# Patient Record
Sex: Male | Born: 1985 | Race: Black or African American | Hispanic: No | Marital: Single | Smoking: Current some day smoker
Health system: Southern US, Community
[De-identification: ages and names within clinical notes are randomized; demographics above are authoritative.]

## PROBLEM LIST (undated history)

## (undated) DIAGNOSIS — F259 Schizoaffective disorder, unspecified: Secondary | ICD-10-CM

---
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
## 2013-02-05 ENCOUNTER — Emergency Department (HOSPITAL_COMMUNITY)
Admission: EM | Admit: 2013-02-05 | Discharge: 2013-02-05 | Disposition: A | Discharge status: Home / Self Care | Payer: Medicaid Other | Attending: Emergency Medicine | Admitting: Emergency Medicine

## 2013-02-05 ENCOUNTER — Encounter (HOSPITAL_COMMUNITY): Payer: Self-pay

## 2013-02-05 DIAGNOSIS — L089 Local infection of the skin and subcutaneous tissue, unspecified: Secondary | ICD-10-CM | POA: Insufficient documentation

## 2013-02-05 MED ORDER — DOXYCYCLINE HYCLATE 100 MG PO CAPS: 100.0000 mg | ORAL_CAPSULE | Freq: Two times a day (BID) | ORAL | Status: DC

## 2013-02-05 MED ORDER — DOXYCYCLINE HYCLATE 100 MG PO TABS
100.0000 mg | ORAL_TABLET | Freq: Once | ORAL | Status: AC
  Administered 2013-02-05: 100 mg via ORAL
  Filled 2013-02-05: qty 1

## 2013-02-05 MED ORDER — HYDROCODONE-ACETAMINOPHEN 5-325 MG PO TABS: 2.0000 | ORAL_TABLET | ORAL | Status: DC | PRN

## 2013-02-05 NOTE — ED Provider Notes (Signed)
   History    CSN: [id number] Arrival date & time 02/05/13  1011  First MD Initiated Contact with Patient 02/05/13 1022     Chief Complaint  Patient presents with  . Groin Swelling    HPI   Pt noticed swelling to groin area (inguinal) area since yesterday . States it hurts to walk and move around. Denies any urinary symptoms.no history of similar symptoms in the past.  No fever chills.  No dysuria.  No penile discharge.  No testicular pain.  No nausea vomiting.  Bowel movements are normal.      History reviewed. No pertinent past medical history. History reviewed. No pertinent past surgical history. No family history on file. History  Substance Use Topics  . Smoking status: Never Smoker   . Smokeless tobacco: Not on file  . Alcohol Use: Yes     Comment: socially    Review of Systems All other systems reviewed and are negative Allergies  Review of patient's allergies indicates no known allergies.  Home Medications   Current Outpatient Rx  Name  Route  Sig  Dispense  Refill  . doxycycline (VIBRAMYCIN) 100 MG capsule   Oral   Take 1 capsule (100 mg total) by mouth 2 (two) times daily.   20 capsule   0   . HYDROcodone-acetaminophen (NORCO/VICODIN) 5-325 MG per tablet   Oral   Take 2 tablets by mouth every 4 (four) hours as needed for pain.   20 tablet   0    BP 115/69  Pulse 64  Temp(Src) 98.3 F (36.8 C) (Oral)  Resp 18  Ht 5\' 11"  (1.803 m)  Wt 177 lb (80.287 kg)  BMI 24.7 kg/m2  SpO2 99% Physical Exam  Nursing note and vitals reviewed. Constitutional: He is oriented to person, place, and time. He appears well-developed and well-nourished. No distress.  HENT:  Head: Normocephalic and atraumatic.  Eyes: Pupils are equal, round, and reactive to light.  Neck: Normal range of motion.  Cardiovascular: Normal rate and intact distal pulses.   Pulmonary/Chest: No respiratory distress.  Abdominal: Normal appearance. He exhibits no distension.  Genitourinary:  Penis normal.     Area with mild redness and some tenderness.  No direct or indirect hernia appreciated.  No evidence of epididymal or testicular abnormalities.  Musculoskeletal: Normal range of motion.  Neurological: He is alert and oriented to person, place, and time. No cranial nerve deficit.  Skin: Skin is warm and dry. No rash noted.  Psychiatric: He has a normal mood and affect. His behavior is normal.    ED Course  Procedures (including critical care time) Labs Reviewed - No data to display No results found. 1. Soft tissue infection     MDM  Will treat and bring back for recheck in 48 hours.  Nelia Shi, MD 02/05/13 2056

## 2013-02-05 NOTE — ED Notes (Signed)
Pt noticed swelling to groin area (inguinal) area since yesterday after lifting 25 lb weights. States it hurts to walk and move around. Denies any urinary symptoms.

## 2013-02-09 ENCOUNTER — Encounter (HOSPITAL_COMMUNITY): Payer: Self-pay

## 2013-02-09 ENCOUNTER — Emergency Department (HOSPITAL_COMMUNITY)
Admission: EM | Admit: 2013-02-09 | Discharge: 2013-02-09 | Disposition: A | Discharge status: Home / Self Care | Payer: Medicaid Other | Attending: Emergency Medicine | Admitting: Emergency Medicine

## 2013-02-09 DIAGNOSIS — L02219 Cutaneous abscess of trunk, unspecified: Secondary | ICD-10-CM | POA: Insufficient documentation

## 2013-02-09 DIAGNOSIS — L03319 Cellulitis of trunk, unspecified: Secondary | ICD-10-CM | POA: Insufficient documentation

## 2013-02-09 DIAGNOSIS — L02214 Cutaneous abscess of groin: Secondary | ICD-10-CM

## 2013-02-09 LAB — URINALYSIS, ROUTINE W REFLEX MICROSCOPIC
Bilirubin Urine: NEGATIVE
Hgb urine dipstick: NEGATIVE
Nitrite: NEGATIVE
Specific Gravity, Urine: 1.031 — ABNORMAL HIGH (ref 1.005–1.030)
Urobilinogen, UA: 1 mg/dL (ref 0.0–1.0)
pH: 6.5 (ref 5.0–8.0)

## 2013-02-09 NOTE — ED Provider Notes (Signed)
History    CSN: [id number] Arrival date & time 02/09/13  1942  First MD Initiated Contact with Patient 02/09/13 2009     Chief Complaint  Patient presents with  . Groin Pain   (Consider location/radiation/quality/duration/timing/severity/associated sxs/prior Treatment) HPI Comments: Patient is a 27 year old male with no significant past medical history who presents for left-sided groin pain x 5 days. Patient states the pain is throbbing, nonradiating and intermittent, worse with walking and palpation and alleviated with vicodin. Patient was seen and evaluated for this complaint on 02/05/13; dx with inguinal abscess and d/c'd with Doxy and Norco. Patient states that he has been compliant with these medications. He also states that abscess began to spontaneously drain 2 days ago. He has been trying warm soaks with mild relief. Patient denies associated fever, penile discharge, penile redness or swelling, testicular or scrotal pain, dysuria, hematuria, and urinary frequency.  Patient is a 27 y.o. male presenting with groin pain. The history is provided by the patient. No language interpreter was used.  Groin Pain Pertinent negatives include no fever, numbness or weakness.   History reviewed. No pertinent past medical history. History reviewed. No pertinent past surgical history. History reviewed. No pertinent family history. History  Substance Use Topics  . Smoking status: Never Smoker   . Smokeless tobacco: Not on file  . Alcohol Use: Yes     Comment: socially    Review of Systems  Constitutional: Negative for fever.  Genitourinary: Negative for dysuria, hematuria, discharge, penile swelling, scrotal swelling, penile pain and testicular pain.  Skin:       +abscess  Neurological: Negative for weakness and numbness.  All other systems reviewed and are negative.    Allergies  Review of patient's allergies indicates no known allergies.  Home Medications   Current Outpatient Rx    Name  Route  Sig  Dispense  Refill  . doxycycline (VIBRAMYCIN) 100 MG capsule   Oral   Take 1 capsule (100 mg total) by mouth 2 (two) times daily.   20 capsule   0   . HYDROcodone-acetaminophen (NORCO/VICODIN) 5-325 MG per tablet   Oral   Take 2 tablets by mouth every 4 (four) hours as needed for pain.   20 tablet   0    BP 121/53  Pulse 62  Temp(Src) 98.1 F (36.7 C) (Oral)  Resp 16  SpO2 100% Physical Exam  Nursing note and vitals reviewed. Constitutional: He is oriented to person, place, and time. He appears well-developed and well-nourished. No distress.  HENT:  Head: Normocephalic and atraumatic.  Eyes: Conjunctivae and EOM are normal. No scleral icterus.  Neck: Normal range of motion.  Cardiovascular: Normal rate, regular rhythm and intact distal pulses.   Pulmonary/Chest: Effort normal. No respiratory distress.  Abdominal: Soft. He exhibits no distension. There is no tenderness. Hernia confirmed negative in the right inguinal area and confirmed negative in the left inguinal area.  Genitourinary: Testes normal and penis normal.    Cremasteric reflex is present. Right testis shows no swelling and no tenderness. Left testis shows no swelling and no tenderness. No penile erythema or penile tenderness. No discharge found.  Chaperoned GU exam findings as described  Lymphadenopathy:       Right: No inguinal adenopathy present.  Neurological: He is alert and oriented to person, place, and time.  Skin: He is not diaphoretic.  Psychiatric: He has a normal mood and affect. His behavior is normal.    ED Course  Procedures (including critical  care time) Labs Reviewed  URINALYSIS, ROUTINE W REFLEX MICROSCOPIC - Abnormal; Notable for the following:    Color, Urine AMBER (*)    Specific Gravity, Urine 1.031 (*)    All other components within normal limits   No results found.  1. Abscess of groin, left    MDM  28 year old male presents for uncomplicated abscess of the  left groin. Patient afebrile and hemodynamically stable. Physical exam as above. Abscess site actively draining serosanguineous fluid. There is mild tenderness to palpation and erythema, but no heat-to-touch or area of fluctuance to suspect drainable abscess. Patient has completed half of doxy course. UA negative for infection. Given findings and work up in ED today, believe patient is stable and appropriate for discharge with urology followup for further evaluation of symptoms. Patient advised to continue antibiotics as prescribed and to use topical warm compresses or warm soaks to promote further drainage. Indications for return discussed. Patient verbalizes comfort and understanding with this d/c plan.     Antony Madura, PA-C 02/09/13 2202

## 2013-02-09 NOTE — ED Notes (Signed)
Pt c/o Left side groin pain x1 week, pt seen here 02/05/2013 for the same. Pt c/o increase pain

## 2013-02-10 NOTE — ED Provider Notes (Signed)
  Medical screening examination/treatment/procedure(s) were performed by non-physician practitioner and as supervising physician I was immediately available for consultation/collaboration.    Gerhard Munch, MD 02/10/13 0002

## 2019-01-22 ENCOUNTER — Emergency Department (HOSPITAL_COMMUNITY)
Admission: EM | Admit: 2019-01-22 | Discharge: 2019-01-22 | Disposition: A | Discharge status: Home / Self Care | Payer: Medicaid Other | Attending: Emergency Medicine | Admitting: Emergency Medicine

## 2019-01-22 ENCOUNTER — Other Ambulatory Visit: Payer: Self-pay

## 2019-01-22 ENCOUNTER — Encounter (HOSPITAL_COMMUNITY): Payer: Self-pay | Admitting: Emergency Medicine

## 2019-01-22 ENCOUNTER — Emergency Department (HOSPITAL_COMMUNITY): Payer: Medicaid Other

## 2019-01-22 DIAGNOSIS — M25511 Pain in right shoulder: Secondary | ICD-10-CM | POA: Diagnosis not present

## 2019-01-22 DIAGNOSIS — M7918 Myalgia, other site: Secondary | ICD-10-CM

## 2019-01-22 MED ORDER — NAPROXEN 500 MG PO TABS: 500.0000 mg | ORAL_TABLET | Freq: Two times a day (BID) | ORAL | 0 refills | Status: DC
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
## 2019-01-22 NOTE — Discharge Instructions (Addendum)
Take naproxen 2 times a day with meals.  Do not take other anti-inflammatories at the same time open (Advil, Motrin, ibuprofen, Aleve). You may supplement with Tylenol if you need further pain control. Use muscle creams such as salonpas, icy hot, bengay, biofreeze for pain control.  Use ice packs or heating pads if this helps control your pain. You will likely have continued muscle stiffness and soreness over the next couple days.   Return to the emergency room if you develop numbness, severe pain with breathing, your are dropping things from your hand, or any new or worsening symptoms.
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
## 2019-02-06 ENCOUNTER — Encounter (HOSPITAL_COMMUNITY): Payer: Self-pay | Admitting: Emergency Medicine

## 2019-02-06 ENCOUNTER — Emergency Department (HOSPITAL_COMMUNITY)
Admission: EM | Admit: 2019-02-06 | Discharge: 2019-02-06 | Disposition: A | Discharge status: Home / Self Care | Payer: Medicaid Other | Attending: Emergency Medicine | Admitting: Emergency Medicine

## 2019-02-06 ENCOUNTER — Other Ambulatory Visit: Payer: Self-pay

## 2019-02-06 DIAGNOSIS — Z5321 Procedure and treatment not carried out due to patient leaving prior to being seen by health care provider: Secondary | ICD-10-CM | POA: Insufficient documentation

## 2019-02-06 DIAGNOSIS — M545 Low back pain: Secondary | ICD-10-CM | POA: Diagnosis present

## 2019-02-06 DIAGNOSIS — Y9389 Activity, other specified: Secondary | ICD-10-CM | POA: Insufficient documentation

## 2019-02-06 DIAGNOSIS — Y929 Unspecified place or not applicable: Secondary | ICD-10-CM | POA: Diagnosis not present

## 2019-02-06 DIAGNOSIS — Y998 Other external cause status: Secondary | ICD-10-CM | POA: Diagnosis not present

## 2019-02-06 NOTE — ED Notes (Signed)
Bed: WTR7 Expected date:  Expected time:  Means of arrival:  Comments: 
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
## 2019-05-12 ENCOUNTER — Other Ambulatory Visit: Payer: Self-pay

## 2019-05-12 ENCOUNTER — Emergency Department (HOSPITAL_COMMUNITY)
Admission: EM | Admit: 2019-05-12 | Discharge: 2019-05-12 | Disposition: A | Discharge status: Home / Self Care | Payer: Medicaid Other | Attending: Emergency Medicine | Admitting: Emergency Medicine

## 2019-05-12 ENCOUNTER — Encounter (HOSPITAL_COMMUNITY): Payer: Self-pay | Admitting: Emergency Medicine

## 2019-05-12 DIAGNOSIS — R112 Nausea with vomiting, unspecified: Secondary | ICD-10-CM | POA: Diagnosis present

## 2019-05-12 DIAGNOSIS — Z5321 Procedure and treatment not carried out due to patient leaving prior to being seen by health care provider: Secondary | ICD-10-CM | POA: Diagnosis not present

## 2019-05-12 DIAGNOSIS — R070 Pain in throat: Secondary | ICD-10-CM | POA: Insufficient documentation

## 2019-05-12 LAB — URINALYSIS, ROUTINE W REFLEX MICROSCOPIC
Bilirubin Urine: NEGATIVE
Glucose, UA: NEGATIVE mg/dL
Hgb urine dipstick: NEGATIVE
Ketones, ur: NEGATIVE mg/dL
Leukocytes,Ua: NEGATIVE
Nitrite: NEGATIVE
Protein, ur: NEGATIVE mg/dL
Specific Gravity, Urine: 1.027 (ref 1.005–1.030)
pH: 6 (ref 5.0–8.0)

## 2019-05-12 LAB — CBC
HCT: 46.8 % (ref 39.0–52.0)
Hemoglobin: 15 g/dL (ref 13.0–17.0)
MCH: 30.1 pg (ref 26.0–34.0)
MCHC: 32.1 g/dL (ref 30.0–36.0)
MCV: 94 fL (ref 80.0–100.0)
Platelets: 293 10*3/uL (ref 150–400)
RBC: 4.98 MIL/uL (ref 4.22–5.81)
RDW: 11.9 % (ref 11.5–15.5)
WBC: 6 10*3/uL (ref 4.0–10.5)
nRBC: 0 % (ref 0.0–0.2)

## 2019-05-12 LAB — COMPREHENSIVE METABOLIC PANEL
ALT: 16 U/L (ref 0–44)
AST: 21 U/L (ref 15–41)
Albumin: 4.4 g/dL (ref 3.5–5.0)
Alkaline Phosphatase: 53 U/L (ref 38–126)
Anion gap: 10 (ref 5–15)
BUN: 16 mg/dL (ref 6–20)
CO2: 23 mmol/L (ref 22–32)
Calcium: 9.5 mg/dL (ref 8.9–10.3)
Chloride: 104 mmol/L (ref 98–111)
Creatinine, Ser: 1.21 mg/dL (ref 0.61–1.24)
GFR calc Af Amer: >60 mL/min (ref >60)
GFR calc non Af Amer: >60 mL/min (ref >60)
Glucose, Bld: 98 mg/dL (ref 70–99)
Potassium: 4 mmol/L (ref 3.5–5.1)
Sodium: 137 mmol/L (ref 135–145)
Total Bilirubin: 0.6 mg/dL (ref 0.3–1.2)
Total Protein: 8 g/dL (ref 6.5–8.1)

## 2019-05-12 LAB — LIPASE, BLOOD: Lipase: 24 U/L (ref 11–51)

## 2019-05-12 MED ORDER — SODIUM CHLORIDE 0.9% FLUSH: 3.0000 mL | Freq: Once | INTRAVENOUS | Status: DC

## 2019-05-12 NOTE — ED Notes (Signed)
No answer in waiting room when called for vitals recheck

## 2019-05-12 NOTE — ED Triage Notes (Signed)
C/o nausea and vomiting x 2 weeks.  Denies abd pain and diarrhea.  States he had a sore throat that has resolved.

## 2019-11-13 ENCOUNTER — Emergency Department (HOSPITAL_COMMUNITY)
Admission: EM | Admit: 2019-11-13 | Discharge: 2019-11-13 | Disposition: A | Discharge status: Home / Self Care | Payer: Medicaid Other | Attending: Emergency Medicine | Admitting: Emergency Medicine

## 2019-11-13 ENCOUNTER — Encounter (HOSPITAL_COMMUNITY): Payer: Self-pay | Admitting: Emergency Medicine

## 2019-11-13 ENCOUNTER — Emergency Department (HOSPITAL_COMMUNITY): Payer: Medicaid Other

## 2019-11-13 DIAGNOSIS — Z79899 Other long term (current) drug therapy: Secondary | ICD-10-CM | POA: Diagnosis not present

## 2019-11-13 DIAGNOSIS — U071 COVID-19: Secondary | ICD-10-CM | POA: Insufficient documentation

## 2019-11-13 DIAGNOSIS — R112 Nausea with vomiting, unspecified: Secondary | ICD-10-CM | POA: Diagnosis present

## 2019-11-13 LAB — CBC
HCT: 48.7 % (ref 39.0–52.0)
Hemoglobin: 15.8 g/dL (ref 13.0–17.0)
MCH: 30.1 pg (ref 26.0–34.0)
MCHC: 32.4 g/dL (ref 30.0–36.0)
MCV: 92.8 fL (ref 80.0–100.0)
Platelets: 207 10*3/uL (ref 150–400)
RBC: 5.25 MIL/uL (ref 4.22–5.81)
RDW: 11.6 % (ref 11.5–15.5)
WBC: 3.6 10*3/uL — ABNORMAL LOW (ref 4.0–10.5)
nRBC: 0 % (ref 0.0–0.2)

## 2019-11-13 LAB — COMPREHENSIVE METABOLIC PANEL
ALT: 17 U/L (ref 0–44)
AST: 29 U/L (ref 15–41)
Albumin: 4.1 g/dL (ref 3.5–5.0)
Alkaline Phosphatase: 48 U/L (ref 38–126)
Anion gap: 10 (ref 5–15)
BUN: 13 mg/dL (ref 6–20)
CO2: 23 mmol/L (ref 22–32)
Calcium: 8.8 mg/dL — ABNORMAL LOW (ref 8.9–10.3)
Chloride: 104 mmol/L (ref 98–111)
Creatinine, Ser: 1.08 mg/dL (ref 0.61–1.24)
GFR calc Af Amer: >60 mL/min (ref >60)
GFR calc non Af Amer: >60 mL/min (ref >60)
Glucose, Bld: 117 mg/dL — ABNORMAL HIGH (ref 70–99)
Potassium: 3.6 mmol/L (ref 3.5–5.1)
Sodium: 137 mmol/L (ref 135–145)
Total Bilirubin: 0.5 mg/dL (ref 0.3–1.2)
Total Protein: 7.6 g/dL (ref 6.5–8.1)

## 2019-11-13 LAB — URINALYSIS, ROUTINE W REFLEX MICROSCOPIC
Bacteria, UA: NONE SEEN
Bilirubin Urine: NEGATIVE
Glucose, UA: NEGATIVE mg/dL
Hgb urine dipstick: NEGATIVE
Ketones, ur: 5 mg/dL — AB
Leukocytes,Ua: NEGATIVE
Nitrite: NEGATIVE
Protein, ur: 30 mg/dL — AB
Specific Gravity, Urine: 1.024 (ref 1.005–1.030)
pH: 6 (ref 5.0–8.0)

## 2019-11-13 LAB — POC SARS CORONAVIRUS 2 AG -  ED: SARS Coronavirus 2 Ag: POSITIVE — AB

## 2019-11-13 LAB — LIPASE, BLOOD: Lipase: 26 U/L (ref 11–51)

## 2019-11-13 MED ORDER — LACTATED RINGERS IV BOLUS
1000.0000 mL | Freq: Once | INTRAVENOUS | Status: AC
  Administered 2019-11-13: 18:00:00 1000 mL via INTRAVENOUS

## 2019-11-13 MED ORDER — ONDANSETRON 4 MG PO TBDP
4.0000 mg | ORAL_TABLET | Freq: Once | ORAL | Status: AC | PRN
  Administered 2019-11-13: 4 mg via ORAL
  Filled 2019-11-13: qty 1

## 2019-11-13 MED ORDER — ONDANSETRON HCL 4 MG PO TABS: 4.0000 mg | ORAL_TABLET | Freq: Three times a day (TID) | ORAL | 0 refills | Status: DC | PRN

## 2019-11-13 MED ORDER — SODIUM CHLORIDE 0.9% FLUSH: 3.0000 mL | Freq: Once | INTRAVENOUS | Status: DC

## 2019-11-13 NOTE — ED Triage Notes (Signed)
Pt c/o n/v, chills, intermittent fatigue, minor cough, back pain, and subjective fever x 3-4 days. No known sick exposure, no covid test

## 2019-11-13 NOTE — ED Notes (Signed)
Pt verbalizes understanding of d/c instructions. Prescriptions reviewed with patient. Pt ambulatory at d/c with all belongings.  

## 2019-11-13 NOTE — ED Provider Notes (Signed)
Antonio Richardson Provider Note   CSN: [id number] Arrival date & time: 11/13/19  1450     History Chief Complaint  Patient presents with  . Emesis    Antonio Richardson is a 34 y.o. male.  HPI   34 year old male without pertinent previous history presenting to the emergency [facility] complaining of a myriad of symptoms including nausea, NBNB emesis, chills, fatigue, cough and subjective fever the past 3 to 4 days. Loss of taste and feeling dehydrated. Patient also notes having associated back pain.   Patient is not complaining of any chest pain, shortness of breath, abdominal pain, changes in bowel or bladder function. Patient given a short course of ibuprofen several days ago without improvement. No heavy lifting or twisting. No trauma to the back.  Patient had a recent close Covid contact and his daughter.  History reviewed. No pertinent past medical history.  There are no problems to display for this patient.   History reviewed. No pertinent surgical history.     No family history on file.  Social History   Tobacco Use  . Smoking status: Never Smoker  . Smokeless tobacco: Never Used  Substance Use Topics  . Alcohol use: Yes    Comment: socially  . Drug use: Yes    Types: Marijuana    Home Medications Prior to Admission medications   Medication Sig Start Date End Date Taking? Authorizing Provider  doxycycline (VIBRAMYCIN) 100 MG capsule Take 1 capsule (100 mg total) by mouth 2 (two) times daily. 02/05/13   Leonard Schwartz, MD  HYDROcodone-acetaminophen (NORCO/VICODIN) 5-325 MG per tablet Take 2 tablets by mouth every 4 (four) hours as needed for pain. 02/05/13   Leonard Schwartz, MD  naproxen (NAPROSYN) 500 MG tablet Take 1 tablet (500 mg total) by mouth 2 (two) times daily with a meal. 01/22/19   Caccavale, Sophia, PA-C    Allergies    Patient has no known allergies.  Review of Systems   Review of Systems  Constitutional: Positive for  activity change, appetite change, chills, fatigue and fever. Negative for diaphoresis.  HENT: Positive for congestion and rhinorrhea.   Respiratory: Positive for cough. Negative for shortness of breath and wheezing.   Cardiovascular: Negative for chest pain.  Gastrointestinal: Positive for nausea and vomiting. Negative for abdominal distention, abdominal pain and diarrhea.  Genitourinary: Negative for decreased urine volume, difficulty urinating, dysuria, frequency and urgency.  Musculoskeletal: Positive for back pain and myalgias. Negative for gait problem.  Skin: Negative for color change and wound.  Neurological: Negative for dizziness, syncope, weakness, light-headedness and headaches.  All other systems reviewed and are negative.   Physical Exam Updated Vital Signs BP 136/90 (BP Location: Right Arm)   Pulse 97   Temp 98.3 F (36.8 C) (Oral)   Resp 18   SpO2 98%   Physical Exam Vitals and nursing note reviewed.  Constitutional:      General: He is not in acute distress.    Appearance: Normal appearance. He is normal weight. He is not ill-appearing or toxic-appearing.  HENT:     Head: Normocephalic.     Right Ear: External ear normal.     Left Ear: External ear normal.     Nose: Nose normal.     Mouth/Throat:     Mouth: Mucous membranes are moist.     Pharynx: Oropharynx is clear.  Eyes:     Extraocular Movements: Extraocular movements intact.     Pupils: Pupils are equal, round, and  reactive to light.     Comments: No conjunctival pallor  Cardiovascular:     Rate and Rhythm: Normal rate and regular rhythm.     Pulses: Normal pulses.     Heart sounds: Normal heart sounds.  Pulmonary:     Effort: Pulmonary effort is normal. No respiratory distress.     Breath sounds: Normal breath sounds. No wheezing or rhonchi.  Abdominal:     General: Bowel sounds are normal.     Palpations: Abdomen is soft.     Tenderness: There is no abdominal tenderness. There is no guarding.    Musculoskeletal:     Cervical back: Normal range of motion.     Right lower leg: No edema.     Left lower leg: No edema.  Skin:    General: Skin is warm and dry.     Capillary Refill: Capillary refill takes less than 2 seconds.  Neurological:     General: No focal deficit present.     Mental Status: He is alert and oriented to person, place, and time. Mental status is at baseline.  Psychiatric:        Mood and Affect: Mood normal.     ED Results / Procedures / Treatments   Labs (all labs ordered are listed, but only abnormal results are displayed) Labs Reviewed  COMPREHENSIVE METABOLIC PANEL - Abnormal; Notable for the following components:      Result Value   Glucose, Bld 117 (*)    Calcium 8.8 (*)    All other components within normal limits  CBC - Abnormal; Notable for the following components:   WBC 3.6 (*)    All other components within normal limits  POC SARS CORONAVIRUS 2 AG -  ED - Abnormal; Notable for the following components:   SARS Coronavirus 2 Ag POSITIVE (*)    All other components within normal limits  LIPASE, BLOOD  URINALYSIS, ROUTINE W REFLEX MICROSCOPIC    EKG None  Radiology DG Chest Portable 1 View  Result Date: 11/13/2019 CLINICAL DATA:  34 year old male with concern for pneumonia. EXAM: PORTABLE CHEST 1 VIEW COMPARISON:  None. FINDINGS: The heart size and mediastinal contours are within normal limits. Both lungs are clear. The visualized skeletal structures are unremarkable. IMPRESSION: No active disease. Electronically Signed   By: Anner Crete M.D.   On: 11/13/2019 16:38    Procedures Procedures (including critical care time)  Medications Ordered in ED Medications  sodium chloride flush (NS) 0.9 % injection 3 mL (has no administration in time range)  lactated ringers bolus 1,000 mL (has no administration in time range)  ondansetron (ZOFRAN-ODT) disintegrating tablet 4 mg (has no administration in time range)    ED Course  I have  reviewed the triage vital signs and the nursing notes.  Pertinent labs & imaging results that were available during my care of the patient were reviewed by me and considered in my medical decision making (see chart for details).    MDM Rules/Calculators/A&P                      34 year old male without pertinent previous history presenting to the emergency [facility] complaining of a myriad of symptoms including nausea, NBNB emesis, chills, fatigue, cough and subjective fever the past 3 to 4 days.  Differential diagnoses considered include COVID-19 infection, flulike illness, viral syndrome, pneumonia, low suspicion for ACS, PE, PTX, pericarditis or myocarditis as patient is overall well-appearing and history and physical are inconsistent  with these entities.   Initial interventions treated with 1 L IV LR  CXR interpreted by me demonstrated no acute cardiopulmonary processes  Labs demonstrated negative lipase, mild hypocalcemia at 8.8 otherwise no significant arrangements on CMP, mild leukopenia 3.6 otherwise no semen derangements on CBC without any anemia, thrombocytopenia or thrombocytosis, COVID +.  Upon reassessment patient feels slightly improved following IV fluids.  Feel the patient is safe and stable for discharge.  Provide patient with information about Covid as well as quarantining.  The patient is safe and stable for discharge at this time with return precautions provided and a plan for follow up care in place as needed  The plan for this patient was discussed with Dr. Rex Kras, who voiced agreement and who oversaw evaluation and treatment of this patient.  Final Clinical Impression(s) / ED Diagnoses Final diagnoses:  [id number]    Rx / DC Orders ED Discharge Orders    None       Filbert Berthold, MD 11/13/19 1722    Little, Wenda Overland, MD 11/17/19 [phone]

## 2019-12-05 ENCOUNTER — Other Ambulatory Visit: Payer: Self-pay

## 2019-12-05 ENCOUNTER — Emergency Department (HOSPITAL_COMMUNITY)
Admission: EM | Admit: 2019-12-05 | Discharge: 2019-12-06 | Disposition: A | Discharge status: Home / Self Care | Payer: Medicaid Other | Attending: Emergency Medicine | Admitting: Emergency Medicine

## 2019-12-05 ENCOUNTER — Encounter (HOSPITAL_COMMUNITY): Payer: Self-pay | Admitting: Emergency Medicine

## 2019-12-05 DIAGNOSIS — Z046 Encounter for general psychiatric examination, requested by authority: Secondary | ICD-10-CM | POA: Diagnosis not present

## 2019-12-05 DIAGNOSIS — E876 Hypokalemia: Secondary | ICD-10-CM | POA: Insufficient documentation

## 2019-12-05 DIAGNOSIS — F1219 Cannabis abuse with unspecified cannabis-induced disorder: Secondary | ICD-10-CM | POA: Diagnosis present

## 2019-12-05 DIAGNOSIS — F329 Major depressive disorder, single episode, unspecified: Secondary | ICD-10-CM | POA: Insufficient documentation

## 2019-12-05 DIAGNOSIS — F22 Delusional disorders: Secondary | ICD-10-CM | POA: Diagnosis present

## 2019-12-05 DIAGNOSIS — R45851 Suicidal ideations: Secondary | ICD-10-CM | POA: Insufficient documentation

## 2019-12-05 LAB — COMPREHENSIVE METABOLIC PANEL
ALT: 16 U/L (ref 0–44)
AST: 36 U/L (ref 15–41)
Albumin: 4.4 g/dL (ref 3.5–5.0)
Alkaline Phosphatase: 40 U/L (ref 38–126)
Anion gap: 10 (ref 5–15)
BUN: 21 mg/dL — ABNORMAL HIGH (ref 6–20)
CO2: 26 mmol/L (ref 22–32)
Calcium: 9.6 mg/dL (ref 8.9–10.3)
Chloride: 103 mmol/L (ref 98–111)
Creatinine, Ser: 1.21 mg/dL (ref 0.61–1.24)
GFR calc Af Amer: >60 mL/min (ref >60)
GFR calc non Af Amer: >60 mL/min (ref >60)
Glucose, Bld: 109 mg/dL — ABNORMAL HIGH (ref 70–99)
Potassium: 3.3 mmol/L — ABNORMAL LOW (ref 3.5–5.1)
Sodium: 139 mmol/L (ref 135–145)
Total Bilirubin: 0.6 mg/dL (ref 0.3–1.2)
Total Protein: 7.8 g/dL (ref 6.5–8.1)

## 2019-12-05 LAB — RAPID URINE DRUG SCREEN, HOSP PERFORMED
Amphetamines: NOT DETECTED
Barbiturates: NOT DETECTED
Benzodiazepines: NOT DETECTED
Cocaine: NOT DETECTED
Opiates: NOT DETECTED
Tetrahydrocannabinol: POSITIVE — AB

## 2019-12-05 LAB — ACETAMINOPHEN LEVEL: Acetaminophen (Tylenol), Serum: <10 ug/mL — ABNORMAL LOW (ref 10–30)

## 2019-12-05 LAB — CBC WITH DIFFERENTIAL/PLATELET
Abs Immature Granulocytes: 0.02 10*3/uL (ref 0.00–0.07)
Basophils Absolute: 0 10*3/uL (ref 0.0–0.1)
Basophils Relative: 1 %
Eosinophils Absolute: 0 10*3/uL (ref 0.0–0.5)
Eosinophils Relative: 0 %
HCT: 40.7 % (ref 39.0–52.0)
Hemoglobin: 13 g/dL (ref 13.0–17.0)
Immature Granulocytes: 0 %
Lymphocytes Relative: 13 %
Lymphs Abs: 1 10*3/uL (ref 0.7–4.0)
MCH: 29.7 pg (ref 26.0–34.0)
MCHC: 31.9 g/dL (ref 30.0–36.0)
MCV: 92.9 fL (ref 80.0–100.0)
Monocytes Absolute: 1 10*3/uL (ref 0.1–1.0)
Monocytes Relative: 13 %
Neutro Abs: 5.6 10*3/uL (ref 1.7–7.7)
Neutrophils Relative %: 73 %
Platelets: 327 10*3/uL (ref 150–400)
RBC: 4.38 MIL/uL (ref 4.22–5.81)
RDW: 11.9 % (ref 11.5–15.5)
WBC: 7.7 10*3/uL (ref 4.0–10.5)
nRBC: 0 % (ref 0.0–0.2)

## 2019-12-05 LAB — ETHANOL: Alcohol, Ethyl (B): <10 mg/dL (ref <10)

## 2019-12-05 LAB — SALICYLATE LEVEL: Salicylate Lvl: <7 mg/dL — ABNORMAL LOW (ref 7.0–30.0)

## 2019-12-05 MED ORDER — ACETAMINOPHEN 325 MG PO TABS: 650.0000 mg | ORAL_TABLET | ORAL | Status: DC | PRN

## 2019-12-05 MED ORDER — POTASSIUM CHLORIDE CRYS ER 20 MEQ PO TBCR
40.0000 meq | EXTENDED_RELEASE_TABLET | Freq: Once | ORAL | Status: AC
  Administered 2019-12-05: 40 meq via ORAL
  Filled 2019-12-05: qty 2

## 2019-12-05 MED ORDER — NICOTINE 21 MG/24HR TD PT24: 21.0000 mg | MEDICATED_PATCH | Freq: Every day | TRANSDERMAL | Status: DC

## 2019-12-05 MED ORDER — ONDANSETRON HCL 4 MG PO TABS: 4.0000 mg | ORAL_TABLET | Freq: Three times a day (TID) | ORAL | Status: DC | PRN

## 2019-12-05 MED ORDER — ALUM & MAG HYDROXIDE-SIMETH 200-200-20 MG/5ML PO SUSP: 30.0000 mL | Freq: Four times a day (QID) | ORAL | Status: DC | PRN

## 2019-12-05 MED ORDER — ZOLPIDEM TARTRATE 5 MG PO TABS
5.0000 mg | ORAL_TABLET | Freq: Every evening | ORAL | Status: DC | PRN
  Administered 2019-12-05: 5 mg via ORAL
  Filled 2019-12-05: qty 1

## 2019-12-05 MED ORDER — LORAZEPAM 2 MG/ML IJ SOLN
2.0000 mg | Freq: Once | INTRAMUSCULAR | Status: AC
  Administered 2019-12-05: 2 mg via INTRAMUSCULAR
  Filled 2019-12-05: qty 1

## 2019-12-05 NOTE — ED Provider Notes (Signed)
[facility]-EMERGENCY DEPT Provider Note   CSN: [id number] Arrival date & time: 12/05/19  1732     History Chief Complaint  Patient presents with  . Medical Clearance    Antonio Richardson is a 34 y.o. male.  HPI 34 year old male presents under involuntary commitment.  His fiance committed him.  She is not present but the papers indicate that he has been having issues since he was incarcerated.  He apparently tried to commit suicide while in jail.  He has been abusing ecstasy and marijuana.  The patient reportedly has talked about being the Messiah.  He has been trying to get in other peoples cars.  He also indicates that he has not been eating and has weight loss.  The patient denies all of these and currently is resting with no acute distress in the room.  He denies any acute illness.  He had Covid back in the early April 2021.   No past medical history on file.  There are no problems to display for this patient.   No past surgical history on file.     No family history on file.  Social History   Tobacco Use  . Smoking status: Never Smoker  . Smokeless tobacco: Never Used  Substance Use Topics  . Alcohol use: Yes    Comment: socially  . Drug use: Yes    Types: Marijuana    Home Medications Prior to Admission medications   Medication Sig Start Date End Date Taking? Authorizing Provider  doxycycline (VIBRAMYCIN) 100 MG capsule Take 1 capsule (100 mg total) by mouth 2 (two) times daily. Patient not taking: Reported on 12/05/2019 02/05/13   Nelva Nay, MD  HYDROcodone-acetaminophen (NORCO/VICODIN) 5-325 MG per tablet Take 2 tablets by mouth every 4 (four) hours as needed for pain. Patient not taking: Reported on 12/05/2019 02/05/13   Nelva Nay, MD  naproxen (NAPROSYN) 500 MG tablet Take 1 tablet (500 mg total) by mouth 2 (two) times daily with a meal. Patient not taking: Reported on 12/05/2019 01/22/19   Caccavale, Sophia, PA-C  ondansetron (ZOFRAN) 4  MG tablet Take 1 tablet (4 mg total) by mouth every 8 (eight) hours as needed for nausea or vomiting. Patient not taking: Reported on 12/05/2019 11/13/19   Gracy Bruins, MD    Allergies    Patient has no known allergies.  Review of Systems   Review of Systems  Constitutional: Negative for fever.  Respiratory: Negative for shortness of breath.   Gastrointestinal: Negative for vomiting.  Psychiatric/Behavioral: Negative for hallucinations and suicidal ideas.  All other systems reviewed and are negative.   Physical Exam Updated Vital Signs BP (!) 120/104 (BP Location: Right Arm)   Pulse 100   Temp 99.1 F (37.3 C) (Oral)   Resp 18   Ht 5\' 11"  (1.803 m)   SpO2 98%   BMI 25.10 kg/m   Physical Exam Vitals and nursing note reviewed.  Constitutional:      Appearance: He is well-developed.  HENT:     Head: Normocephalic and atraumatic.     Right Ear: External ear normal.     Left Ear: External ear normal.     Nose: Nose normal.  Eyes:     General:        Right eye: No discharge.        Left eye: No discharge.  Cardiovascular:     Rate and Rhythm: Normal rate and regular rhythm.     Heart sounds: Normal heart sounds.  Pulmonary:     Effort: Pulmonary effort is normal.     Breath sounds: Normal breath sounds.  Abdominal:     Palpations: Abdomen is soft.     Tenderness: There is no abdominal tenderness.  Musculoskeletal:     Cervical back: Neck supple.  Skin:    General: Skin is warm and dry.  Neurological:     Mental Status: He is alert and oriented to person, place, and time.  Psychiatric:        Attention and Perception: He does not perceive auditory or visual hallucinations.        Mood and Affect: Mood is not anxious.        Thought Content: Thought content is not paranoid or delusional. Thought content does not include suicidal ideation.     ED Results / Procedures / Treatments   Labs (all labs ordered are listed, but only abnormal results are  displayed) Labs Reviewed  ACETAMINOPHEN LEVEL - Abnormal; Notable for the following components:      Result Value   Acetaminophen (Tylenol), Serum <10 (*)    All other components within normal limits  COMPREHENSIVE METABOLIC PANEL - Abnormal; Notable for the following components:   Potassium 3.3 (*)    Glucose, Bld 109 (*)    BUN 21 (*)    All other components within normal limits  SALICYLATE LEVEL - Abnormal; Notable for the following components:   Salicylate Lvl <7.0 (*)    All other components within normal limits  RAPID URINE DRUG SCREEN, HOSP PERFORMED - Abnormal; Notable for the following components:   Tetrahydrocannabinol POSITIVE (*)    All other components within normal limits  ETHANOL  CBC WITH DIFFERENTIAL/PLATELET    EKG None  Radiology No results found.  Procedures Procedures (including critical care time)  Medications Ordered in ED Medications  acetaminophen (TYLENOL) tablet 650 mg (has no administration in time range)  zolpidem (AMBIEN) tablet 5 mg (5 mg Oral Given 12/05/19 2204)  ondansetron (ZOFRAN) tablet 4 mg (has no administration in time range)  nicotine (NICODERM CQ - dosed in mg/24 hours) patch 21 mg (21 mg Transdermal Refused 12/05/19 1822)  alum & mag hydroxide-simeth (MAALOX/MYLANTA) 200-200-20 MG/5ML suspension 30 mL (has no administration in time range)  potassium chloride SA (KLOR-CON) CR tablet 40 mEq (40 mEq Oral Given 12/05/19 2204)  LORazepam (ATIVAN) injection 2 mg (2 mg Intramuscular Given 12/05/19 2355)    ED Course  I have reviewed the triage vital signs and the nursing notes.  Pertinent labs & imaging results that were available during my care of the patient were reviewed by me and considered in my medical decision making (see chart for details).    MDM Rules/Calculators/A&P                      Patient screening labs are unremarkable save for mild hypokalemia.  He was given potassium.  TTS has been consulted and is recommending  continued observation in the emergency department.  The patient has been placed in psychiatric observation due to the need to provide a safe environment for the patient while obtaining psychiatric consultation and evaluation, as well as ongoing medical and medication management to treat the patient's condition.  The patient has been placed under full IVC at this time.  Final Clinical Impression(s) / ED Diagnoses Final diagnoses:  Involuntary commitment    Rx / DC Orders ED Discharge Orders    None       Criss Alvine,  Nicki Reaper, MD 12/05/19 [phone]
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
## 2019-12-05 NOTE — BH Assessment (Signed)
Clinician contacted pt's fiance, [name], at 2154 for collateral information, as she was who completed pt's IVC paperwork. The information gathered in that phone call can be found in the [facility] Assessment note dated and time-stamped 5/03 9:16PM.  Clinician was contacted by pt's fiance at 2317 in regards to pt not being given an IV and the fact that he was not yet asleep, according to the information she was given by pt's nurse, Dominga Ferry. Clinician stated that she does not know medical information about pt, as in this building we solely work on the mental health aspect of pt's care, and that pt's needs must have been assessed and it was determined that pt did not need an IV. Pt's fiance stated pt was dehydrated and needed an IV of fluids to be completely medically ready for his mental health treatment tomorrow. Clinician expressed an understanding and reminded pt's fiance that this was something that would be determined by pt's doctor. Clinician stated that pt would not be given any medication to "knock him out" unless he was acting out and that if he was given anything to assist him in sleeping, it would be mild so he could fall asleep more naturally. Pt's fiance stated pt will most likely be on his best behavior so it appears he is fine and can go home. Clinician assured pt's sister that his IVC and the information she and I talked about will be documented and that he will be re-assessed in the morning and that our psychiatry team will make the most appropriate choice for pt and his care. Pt's sister expressed an understanding.  [name], pt's fiance: [phone]

## 2019-12-05 NOTE — Progress Notes (Signed)
Received Antonio Richardson at the change of shift awake in his room watching the TV with the sitter at the bedside. Later he became agitated and wanted to leave, he was redirected, but remained very restless and agitated. He was on the floor for an interval, screamed out a couple of times and color at intervals. He received the Ativan IM per order and  eventually drifted off to sleep. He slept throughout the night.

## 2019-12-05 NOTE — ED Triage Notes (Signed)
Since being released from jail (in and out since 2006) pt has been suicidal and received medication for depression recently. He has not been eating and lost a significant amount of weight per IVC report. He has not been sleeping and walking the streets all night.  He is not bathing. He believes that he is the Messiah and the Gods are speaking to him.  He talks to himself and has delusional outburst and begins to cry for no apparent reason. He gets into cars with strangers. He consumes marijuana and ectasy pills daily.   Yehuda Mao  is cousin [phone] will be his contact and available to take him home at discharge.  His fiance is also supportive but not as close. [name] [phone].  On admission to the hospital pt is cooperative, but appears to be anxious and does like to be alone.  In jail he was in the hole for 14 months. Per [employer] pt does make a lot of references to jail.  Thy recommend that we use prompts to let him know what we will be doing next.

## 2019-12-06 ENCOUNTER — Encounter (HOSPITAL_COMMUNITY): Payer: Self-pay | Admitting: Registered Nurse

## 2019-12-06 DIAGNOSIS — Z046 Encounter for general psychiatric examination, requested by authority: Secondary | ICD-10-CM

## 2019-12-06 DIAGNOSIS — F1219 Cannabis abuse with unspecified cannabis-induced disorder: Secondary | ICD-10-CM

## 2019-12-06 NOTE — BH Assessment (Signed)
[facility] Assessment Progress Note  Per Shuvon Rankin, FNP, this pt does not require psychiatric hospitalization at this time.  Pt presents under IVC initiated by pt's fiancee and upheld by EDP Pricilla Loveless, MD, which has been rescinded by Nelly Rout, MD.  Pt is to be discharged from [facility] with recommendation to follow up with [facility].  This has been included in pt's discharge instructions.  Pt's nurse, Waynetta Sandy, has been notified.  Doylene Canning, MA Triage Specialist [phone]

## 2019-12-06 NOTE — ED Notes (Signed)
[employer] called inquiring if pt has been discharged. Family has concerns pt should be listed as a missing person. [employer] aware pt was discharged.

## 2019-12-06 NOTE — ED Provider Notes (Signed)
Emergency Medicine Observation Re-evaluation Note  Antonio Richardson is a 33 y.o. male, seen on rounds today.  Pt initially presented to the ED for complaints of Medical Clearance Currently, the patient is awaiting placement  Physical Exam  BP (!) 120/104 (BP Location: Right Arm)   Pulse 100   Temp 99.1 F (37.3 C) (Oral)   Resp 18   Ht 5\' 11"  (1.803 m)   SpO2 98%   BMI 25.10 kg/m  Physical Exam Alert in nad ED Course / MDM  EKG:    I have reviewed the labs performed to date as well as medications administered while in observation.  Recent changes in the last 24 hours include none Plan  Current plan is for placement Patient is under full IVC at this time.   , MD 12/06/19 [phone]

## 2019-12-06 NOTE — Discharge Instructions (Signed)
For your mental health needs, you are advised to follow up with [facility].  Call them at your earliest opportunity to schedule an intake appointment:       [facility]      [address]      [place], [place] [address]      [phone]      Crisis number: [phone]  

## 2019-12-06 NOTE — Consult Note (Signed)
[facility] Psych ED Discharge  12/06/2019 10:39 AM Antonio Richardson  MRN:  [id number] Principal Problem: Cannabis abuse with cannabis-induced disorder [facility]) Discharge Diagnoses: Principal Problem:   Cannabis abuse with cannabis-induced disorder (HCC)   Subjective: "I had a trip out yesterday.  I think somebody laced my stuff."  Assessment:  Antonio Richardson, 34 y.o., male patient seen via tele psych by this provider, Dr. Lucianne Muss; and chart reviewed on 12/06/19.  On evaluation Antonio Richardson reports he is feeling better this morning; states that his mind is clear.  Patient reports slept well and eating without difficulty.  Patient reporting recently getting out of jail and living with his girlfriend.   During evaluation Antonio Richardson is sitting on side of bed; he is alert/oriented x 4; calm/cooperative; and mood congruent with affect.  Patient is speaking in a clear tone at moderate volume, and normal pace; with good eye contact.  His thought process is coherent and relevant; There is no indication that he is currently responding to internal/external stimuli or experiencing delusional thought content.  Patient denies suicidal/self-harm/homicidal ideation, psychosis, and paranoia.  Patient has remained calm throughout assessment and has answered questions appropriately.  Patient psychiatrically cleared with outpatient psychiatric resources    Total Time spent with patient: 30 minutes  Past Psychiatric History: Cannabis use disorder, Depression  Past Medical History: History reviewed. No pertinent past medical history. History reviewed. No pertinent surgical history. Family History: History reviewed. No pertinent family history. Family Psychiatric  History: Unaware Social History:  Social History   Substance and Sexual Activity  Alcohol Use Yes   Comment: socially     Social History   Substance and Sexual Activity  Drug Use Yes  . Types: Marijuana    Social History   Socioeconomic History  . Marital  status: Single    Spouse name: Not on file  . Number of children: Not on file  . Years of education: Not on file  . Highest education level: Not on file  Occupational History  . Not on file  Tobacco Use  . Smoking status: Never Smoker  . Smokeless tobacco: Never Used  Substance and Sexual Activity  . Alcohol use: Yes    Comment: socially  . Drug use: Yes    Types: Marijuana  . Sexual activity: Yes    Comment: yesterday  Other Topics Concern  . Not on file  Social History Narrative  . Not on file   Social Determinants of Health   Financial Resource Strain:   . Difficulty of Paying Living Expenses:   Food Insecurity:   . Worried About Programme researcher, broadcasting/film/video in the Last Year:   . Barista in the Last Year:   Transportation Needs:   . Freight forwarder (Medical):   Marland Kitchen Lack of Transportation (Non-Medical):   Physical Activity:   . Days of Exercise per Week:   . Minutes of Exercise per Session:   Stress:   . Feeling of Stress :   Social Connections:   . Frequency of Communication with Friends and Family:   . Frequency of Social Gatherings with Friends and Family:   . Attends Religious Services:   . Active Member of Clubs or Organizations:   . Attends Banker Meetings:   Marland Kitchen Marital Status:     Has this patient used any form of tobacco in the last 30 days? (Cigarettes, Smokeless Tobacco, Cigars, and/or Pipes) Prescription not provided because: Does not use tobacco products  Current Medications:  Current Facility-Administered Medications  Medication Dose Route Frequency Provider Last Rate Last Admin  . acetaminophen (TYLENOL) tablet 650 mg  650 mg Oral Q4H PRN Sherwood Gambler, MD      . alum & mag hydroxide-simeth (MAALOX/MYLANTA) 200-200-20 MG/5ML suspension 30 mL  30 mL Oral Q6H PRN Sherwood Gambler, MD      . nicotine (NICODERM CQ - dosed in mg/24 hours) patch 21 mg  21 mg Transdermal Daily Sherwood Gambler, MD      . ondansetron [facility]) tablet 4 mg   4 mg Oral Q8H PRN Sherwood Gambler, MD      . zolpidem (AMBIEN) tablet 5 mg  5 mg Oral QHS PRN Sherwood Gambler, MD   5 mg at 12/05/19 2204   No current outpatient medications on file.   PTA Medications: (Not in a hospital admission)   Musculoskeletal: Strength & Muscle Tone: within normal limits Gait & Station: normal Patient leans: N/A  Psychiatric Specialty Exam: Physical Exam  Review of Systems  Blood pressure 127/85, pulse 71, temperature 98.8 F (37.1 C), temperature source Oral, resp. rate 18, height 5\' 11"  (1.803 m), SpO2 98 %.Body mass index is 25.1 kg/m.  General Appearance: Casual  Eye Contact:  Good  Speech:  Clear and Coherent and Normal Rate  Volume:  Normal  Mood:  "I'm feeling good"  Affect:  Appropriate and Congruent  Thought Process:  Coherent, Goal Directed and Descriptions of Associations: Intact  Orientation:  Full (Time, Place, and Person)  Thought Content:  WDL  Suicidal Thoughts:  No  Homicidal Thoughts:  No  Memory:  Immediate;   Good Recent;   Good  Judgement:  Intact  Insight:  Present  Psychomotor Activity:  Normal  Concentration:  Concentration: Good and Attention Span: Good  Recall:  Good  Fund of Knowledge:  Fair  Language:  Good  Akathisia:  No  Handed:  Right  AIMS (if indicated):     Assets:  Communication Skills Desire for Improvement Housing Physical Health Social Support  ADL's:  Intact  Cognition:  WNL  Sleep:        Demographic Factors:  Male  Loss Factors: Legal issues  Historical Factors: Impulsivity  Risk Reduction Factors:   Sense of responsibility to family, Religious beliefs about death and Living with another person, especially a relative  Continued Clinical Symptoms:  Alcohol/Substance Abuse/Dependencies  Cognitive Features That Contribute To Risk:  None    Suicide Risk:  Minimal: No identifiable suicidal ideation.  Patients presenting with no risk factors but with morbid ruminations; may be  classified as minimal risk based on the severity of the depressive symptoms    Plan Of Care/Follow-up recommendations:  Activity:  As tolerated Diet:  Heart healthy    Discharge Instructions     For your mental health needs, you are advised to follow up with [facility].  Call them at your earliest opportunity to schedule an intake appointment:       [facility] [address]      [place], [place] [address]      [phone]      Crisis number: [phone]      Earleen Newport, NP 12/06/2019, 10:39 AM

## 2019-12-06 NOTE — ED Notes (Signed)
Pt DC off unit to home. Pt alert, cooperative, no s/s of distress. DC information given to pt, pt acknowledged understanding. Belongings given to pt. Pt ambulatory off unit. Pt using bus pass.

## 2020-08-09 ENCOUNTER — Emergency Department (HOSPITAL_COMMUNITY)
Admission: EM | Admit: 2020-08-09 | Discharge: 2020-08-09 | Disposition: A | Discharge status: Home / Self Care | Payer: Medicaid Other | Attending: Emergency Medicine | Admitting: Emergency Medicine

## 2020-08-09 ENCOUNTER — Encounter (HOSPITAL_COMMUNITY): Payer: Self-pay | Admitting: Emergency Medicine

## 2020-08-09 ENCOUNTER — Other Ambulatory Visit: Payer: Self-pay

## 2020-08-09 DIAGNOSIS — R059 Cough, unspecified: Secondary | ICD-10-CM | POA: Diagnosis present

## 2020-08-09 DIAGNOSIS — Z5321 Procedure and treatment not carried out due to patient leaving prior to being seen by health care provider: Secondary | ICD-10-CM | POA: Insufficient documentation

## 2020-08-09 DIAGNOSIS — M549 Dorsalgia, unspecified: Secondary | ICD-10-CM | POA: Insufficient documentation

## 2020-08-09 DIAGNOSIS — U071 COVID-19: Secondary | ICD-10-CM | POA: Insufficient documentation

## 2020-08-09 LAB — RESP PANEL BY RT-PCR (FLU A&B, COVID) ARPGX2
Influenza A by PCR: NEGATIVE
Influenza B by PCR: NEGATIVE
SARS Coronavirus 2 by RT PCR: POSITIVE — AB

## 2020-08-09 MED ORDER — ACETAMINOPHEN 325 MG PO TABS
650.0000 mg | ORAL_TABLET | Freq: Once | ORAL | Status: AC
  Administered 2020-08-09: 650 mg via ORAL
  Filled 2020-08-09: qty 2

## 2020-08-09 NOTE — ED Triage Notes (Signed)
Patient here with possible covid.  States that he has generalized body aches, back pain, mild cough, no sore throat.  Started today.

## 2020-10-19 ENCOUNTER — Other Ambulatory Visit: Payer: Self-pay

## 2020-10-19 ENCOUNTER — Encounter (HOSPITAL_COMMUNITY): Payer: Self-pay | Admitting: Emergency Medicine

## 2020-10-19 ENCOUNTER — Emergency Department (HOSPITAL_COMMUNITY)
Admission: EM | Admit: 2020-10-19 | Discharge: 2020-10-19 | Disposition: A | Discharge status: Home / Self Care | Payer: Medicaid Other | Attending: Emergency Medicine | Admitting: Emergency Medicine

## 2020-10-19 DIAGNOSIS — R21 Rash and other nonspecific skin eruption: Secondary | ICD-10-CM | POA: Diagnosis present

## 2020-10-19 DIAGNOSIS — B009 Herpesviral infection, unspecified: Secondary | ICD-10-CM | POA: Insufficient documentation

## 2020-10-19 MED ORDER — VALACYCLOVIR HCL 1 G PO TABS
1000.0000 mg | ORAL_TABLET | Freq: Every day | ORAL | 0 refills | Status: AC
Start: 2020-10-19 — End: 2020-10-24

## 2020-10-19 NOTE — ED Provider Notes (Signed)
[name] Provider Note   CSN: [id number] Arrival date & time: 10/19/20  1710     History Chief Complaint  Patient presents with  . Groin Pain    Antonio Richardson is a 35 y.o. male.  Rash lesion on the left scrotum, noticed it 1 day ago.  Burning and stinging.  No urinary symptoms no bowel symptoms no penile discharge.  No exposure to similar rash.  Patient is never had this rash before.  Thinks it spider bites.  He tried no pain medication pain is mild to moderate        History reviewed. No pertinent past medical history.  Patient Active Problem List   Diagnosis Date Noted  . Cannabis abuse with cannabis-induced disorder (HCC) 12/06/2019  . Involuntary commitment     History reviewed. No pertinent surgical history.     No family history on file.  Social History   Tobacco Use  . Smoking status: Never Smoker  . Smokeless tobacco: Never Used  Substance Use Topics  . Alcohol use: Yes    Comment: socially  . Drug use: Yes    Types: Marijuana    Home Medications Prior to Admission medications   Medication Sig Start Date End Date Taking? Authorizing Provider  valACYclovir (VALTREX) 1000 MG tablet Take 1 tablet (1,000 mg total) by mouth daily for 5 doses. 10/19/20 10/24/20 Yes Sabino Donovan, MD    Allergies    Patient has no known allergies.  Review of Systems   Review of Systems  Constitutional: Negative for chills and fever.  HENT: Negative for congestion and rhinorrhea.   Respiratory: Negative for cough and shortness of breath.   Cardiovascular: Negative for chest pain and palpitations.  Gastrointestinal: Negative for diarrhea, nausea and vomiting.  Genitourinary: Positive for genital sores. Negative for difficulty urinating, dysuria, hematuria, penile discharge and penile pain.  Musculoskeletal: Negative for arthralgias and back pain.  Skin: Negative for color change and rash.  Neurological: Negative for light-headedness  and headaches.    Physical Exam Updated Vital Signs BP 130/70 (BP Location: Right Arm)   Pulse 88   Temp 98.5 F (36.9 C) (Oral)   Resp 18   Ht 5\' 11"  (1.803 m)   Wt 82 kg   SpO2 98%   BMI 25.21 kg/m   Physical Exam Vitals and nursing note reviewed. Exam conducted with a chaperone present.  Constitutional:      General: He is not in acute distress.    Appearance: Normal appearance.  HENT:     Head: Normocephalic and atraumatic.     Nose: No rhinorrhea.  Eyes:     General:        Right eye: No discharge.        Left eye: No discharge.     Conjunctiva/sclera: Conjunctivae normal.  Cardiovascular:     Rate and Rhythm: Normal rate and regular rhythm.  Pulmonary:     Effort: Pulmonary effort is normal.     Breath sounds: No stridor.  Abdominal:     General: Abdomen is flat. There is no distension.     Palpations: Abdomen is soft.  Genitourinary:    Penis: Normal.      Comments: Herpetic appearing lesions on the left hemiscrotum.  Mild tenderness to palpation no hernia.  Normal cremasteric reflex no tenderness palpation of the testicle Musculoskeletal:        General: No deformity or signs of injury.  Skin:    General: Skin is  warm and dry.  Neurological:     General: No focal deficit present.     Mental Status: He is alert. Mental status is at baseline.     Motor: No weakness.  Psychiatric:        Mood and Affect: Mood normal.        Behavior: Behavior normal.        Thought Content: Thought content normal.     ED Results / Procedures / Treatments   Labs (all labs ordered are listed, but only abnormal results are displayed) Labs Reviewed - No data to display  EKG None  Radiology No results found.  Procedures Procedures   Medications Ordered in ED Medications - No data to display  ED Course  I have reviewed the triage vital signs and the nursing notes.  Pertinent labs & imaging results that were available during my care of the patient were reviewed  by me and considered in my medical decision making (see chart for details).    MDM Rules/Calculators/A&P                          Concern for genital herpes.  Patient will get.  Valacyclovir and outpatient follow-up.  Patient has no other concerns for STI.  He is safe for discharge home strict return precautions given Final Clinical Impression(s) / ED Diagnoses Final diagnoses:  Herpes infection    Rx / DC Orders ED Discharge Orders         Ordered    valACYclovir (VALTREX) 1000 MG tablet  Daily        10/19/20 1759           Sabino Donovan, MD 10/19/20 [phone]

## 2020-10-19 NOTE — ED Triage Notes (Signed)
Patient has noticed bumps in his genital area under his R scrotum. Denies new sexual partners. Endorses pain.

## 2021-01-23 ENCOUNTER — Other Ambulatory Visit: Payer: Self-pay

## 2021-01-23 ENCOUNTER — Encounter (HOSPITAL_COMMUNITY): Payer: Self-pay | Admitting: Emergency Medicine

## 2021-01-23 ENCOUNTER — Ambulatory Visit (HOSPITAL_COMMUNITY)
Admission: EM | Admit: 2021-01-23 | Discharge: 2021-01-23 | Disposition: A | Discharge status: Home / Self Care | Payer: Medicaid Other | Attending: Physician Assistant | Admitting: Physician Assistant

## 2021-01-23 DIAGNOSIS — Z202 Contact with and (suspected) exposure to infections with a predominantly sexual mode of transmission: Secondary | ICD-10-CM

## 2021-01-23 NOTE — ED Provider Notes (Addendum)
[facility]    CSN: [id number] Arrival date & time: 01/23/21  1618      History   Chief Complaint Chief Complaint  Patient presents with   SEXUALLY TRANSMITTED DISEASE    HPI Antonio Richardson is a 35 y.o. male.   Pt reports his girlfriend recently began having "uti symptoms" and is being seen to evaluate for STIs.  He is currently asymptomatic.  Requesting testing.    History reviewed. No pertinent past medical history.  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 No pertinent surgical history.     Home Medications    Prior to Admission medications   Not on File    Family History Family History  Problem Relation Age of Onset   Healthy Mother    Healthy Father     Social History Social History   Tobacco Use   Smoking status: Some Days    Pack years: 0.00    Types: Cigarettes   Smokeless tobacco: Never  Vaping Use   Vaping Use: Some days  Substance Use Topics   Alcohol use: Yes    Comment: socially   Drug use: Yes    Types: Marijuana     Allergies   Patient has no known allergies.   Review of Systems Review of Systems  Constitutional:  Negative for chills and fever.  HENT:  Negative for ear pain and sore throat.   Eyes:  Negative for pain and visual disturbance.  Respiratory:  Negative for cough and shortness of breath.   Cardiovascular:  Negative for chest pain and palpitations.  Gastrointestinal:  Negative for abdominal pain and vomiting.  Genitourinary:  Negative for dysuria and hematuria.  Musculoskeletal:  Negative for arthralgias and back pain.  Skin:  Negative for color change and rash.  Neurological:  Negative for seizures and syncope.  All other systems reviewed and are negative.   Physical Exam Triage Vital Signs ED Triage Vitals  Enc Vitals Group     BP 01/23/21 1724 120/76     Pulse Rate  01/23/21 1724 (!) 58     Resp 01/23/21 1724 18     Temp 01/23/21 1724 98.7 F (37.1 C)     Temp Source 01/23/21 1724 Oral     SpO2 01/23/21 1724 100 %     Weight --      Height --      Head Circumference --      Peak Flow --      Pain Score 01/23/21 1722 0     Pain Loc --      Pain Edu? --      Excl. in GC? --    No data found.  Updated Vital Signs BP 120/76 (BP Location: Right Arm)   Pulse (!) 58   Temp 98.7 F (37.1 C) (Oral)   Resp 18   SpO2 100%   Visual Acuity Right Eye Distance:   Left Eye Distance:   Bilateral Distance:    Right Eye Near:   Left Eye Near:    Bilateral Near:     Physical Exam Vitals and nursing note reviewed.  Constitutional:      Appearance: He is well-developed.  HENT:     Head: Normocephalic and atraumatic.  Eyes:     Conjunctiva/sclera: Conjunctivae normal.  Cardiovascular:     Rate and Rhythm: Normal rate and regular rhythm.  Heart sounds: No murmur heard. Pulmonary:     Effort: Pulmonary effort is normal. No respiratory distress.     Breath sounds: Normal breath sounds.  Abdominal:     Palpations: Abdomen is soft.     Tenderness: There is no abdominal tenderness.  Musculoskeletal:     Cervical back: Neck supple.  Skin:    General: Skin is warm and dry.  Neurological:     Mental Status: He is alert.     UC Treatments / Results  Labs (all labs ordered are listed, but only abnormal results are displayed) Labs Reviewed  CYTOLOGY, (ORAL, ANAL, URETHRAL) ANCILLARY ONLY    EKG   Radiology No results found.  Procedures Procedures (including critical care time)  Medications Ordered in UC Medications - No data to display  Initial Impression / Assessment and Plan / UC Course  I have reviewed the triage vital signs and the nursing notes.  Pertinent labs & imaging results that were available during my care of the patient were reviewed by me and considered in my medical decision making (see chart for details).      Labs pending for gonorrhea, chlamydia, trichomonas.  Will treat based on results.  Discussed safe sex practices.  Final Clinical Impressions(s) / UC Diagnoses   Final diagnoses:  Sexually transmitted disease exposure     Discharge Instructions      Test results pending.  If positive for any sexually transmitted diseases based on lab results will treat.  Abstain from sexually activity until test results are back.    ED Prescriptions   None    PDMP not reviewed this encounter.   Jodell Cipro, PA-C 01/23/21 1806    Jodell Cipro, PA-C 01/23/21 1807

## 2021-01-23 NOTE — ED Triage Notes (Signed)
Patient reports partner is being treated for "uti".  Patient denies symptoms, but requesting evaluation

## 2021-01-23 NOTE — Discharge Instructions (Addendum)
Test results pending.  If positive for any sexually transmitted diseases based on lab results will treat.  Abstain from sexually activity until test results are back.

## 2021-01-24 LAB — CYTOLOGY, (ORAL, ANAL, URETHRAL) ANCILLARY ONLY
Chlamydia: NEGATIVE
Comment: NEGATIVE
Comment: NEGATIVE
Comment: NORMAL
Neisseria Gonorrhea: NEGATIVE
Trichomonas: POSITIVE — AB

## 2021-01-25 ENCOUNTER — Telehealth (HOSPITAL_COMMUNITY): Payer: Self-pay | Admitting: Emergency Medicine

## 2021-01-25 MED ORDER — METRONIDAZOLE 500 MG PO TABS
500.0000 mg | ORAL_TABLET | Freq: Two times a day (BID) | ORAL | 0 refills | Status: DC
Start: 2021-01-25 — End: 2021-03-31

## 2021-03-31 ENCOUNTER — Encounter (HOSPITAL_COMMUNITY): Payer: Self-pay | Admitting: Emergency Medicine

## 2021-03-31 ENCOUNTER — Ambulatory Visit (HOSPITAL_COMMUNITY)
Admission: EM | Admit: 2021-03-31 | Discharge: 2021-03-31 | Disposition: A | Discharge status: Home / Self Care | Payer: Medicaid Other | Attending: Student | Admitting: Student

## 2021-03-31 DIAGNOSIS — R300 Dysuria: Secondary | ICD-10-CM | POA: Insufficient documentation

## 2021-03-31 DIAGNOSIS — Z113 Encounter for screening for infections with a predominantly sexual mode of transmission: Secondary | ICD-10-CM | POA: Insufficient documentation

## 2021-03-31 MED ORDER — CEFTRIAXONE SODIUM 500 MG IJ SOLR
500.0000 mg | Freq: Once | INTRAMUSCULAR | Status: AC
  Administered 2021-03-31: 500 mg via INTRAMUSCULAR

## 2021-03-31 MED ORDER — CEFTRIAXONE SODIUM 500 MG IJ SOLR
INTRAMUSCULAR | Status: AC
  Filled 2021-03-31: qty 500

## 2021-03-31 MED ORDER — DOXYCYCLINE HYCLATE 100 MG PO CAPS
100.0000 mg | ORAL_CAPSULE | Freq: Two times a day (BID) | ORAL | 0 refills | Status: AC
Start: 2021-03-31 — End: 2021-04-07

## 2021-03-31 MED ORDER — LIDOCAINE HCL (PF) 1 % IJ SOLN
INTRAMUSCULAR | Status: AC
  Filled 2021-03-31: qty 2

## 2021-03-31 MED ORDER — METRONIDAZOLE 500 MG PO TABS
500.0000 mg | ORAL_TABLET | Freq: Two times a day (BID) | ORAL | 0 refills | Status: DC
Start: 2021-03-31 — End: 2021-06-05

## 2021-03-31 NOTE — Discharge Instructions (Addendum)
-  We treated you for gonorrhea today with an antibiotic called Rocephin. -For chlamydia, doxycycline twice daily for 7 days.  Make sure to wear sunscreen while spending time outside while on this medication as it can increase your chance of sunburn. You can take this medication with food if you have a sensitive stomach. -For trichomonas, start the antibiotic-Flagyl (metronidazole), 2 pills daily for 7 days.  You can take this with food if you have a sensitive stomach.  Avoid alcohol while taking this medication and for 2 days after as this will cause severe nausea and vomiting. -Abstain from intercourse until negative results and you have completed treatment.

## 2021-03-31 NOTE — ED Provider Notes (Signed)
[facility]    CSN: [id number] Arrival date & time: 03/31/21  1657      History   Chief Complaint Chief Complaint  Patient presents with   Penile Discharge    HPI Antonio Richardson is a 35 y.o. male presenting with dysuria x2 days following unprotected intercourse with new male partner 7 days ago. Medical history  trichomonas 01/2021, completed treatment as directed. Denies hematuria, frequency, urgency, back pain, n/v/d/abd pain, fevers/chills, abdnormal peniledischarge, penile rashes/lesions.    HPI  History reviewed. No pertinent past medical history.  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 No pertinent surgical history.     Home Medications    Prior to Admission medications   Medication Sig Start Date End Date Taking? Authorizing Provider  doxycycline (VIBRAMYCIN) 100 MG capsule Take 1 capsule (100 mg total) by mouth 2 (two) times daily for 7 days. 03/31/21 04/07/21 Yes Rhys Martini, PA-C  metroNIDAZOLE (FLAGYL) 500 MG tablet Take 1 tablet (500 mg total) by mouth 2 (two) times daily. Avoid alcohol while taking this medication and for 2 days after 03/31/21  Yes Rhys Martini, PA-C    Family History Family History  Problem Relation Age of Onset   Healthy Mother    Healthy Father     Social History Social History   Tobacco Use   Smoking status: Some Days    Types: Cigarettes   Smokeless tobacco: Never  Vaping Use   Vaping Use: Some days  Substance Use Topics   Alcohol use: Yes    Comment: socially   Drug use: Yes    Types: Marijuana     Allergies   Patient has no known allergies.   Review of Systems Review of Systems  Constitutional:  Negative for appetite change, chills, diaphoresis, fever and unexpected weight change.  HENT:  Negative for congestion, ear pain, sinus pressure, sinus pain, sneezing, sore throat  and trouble swallowing.   Respiratory:  Negative for cough, chest tightness and shortness of breath.   Cardiovascular:  Negative for chest pain.  Gastrointestinal:  Negative for abdominal distention, abdominal pain, anal bleeding, blood in stool, constipation, diarrhea, nausea, rectal pain and vomiting.  Genitourinary:  Positive for dysuria. Negative for flank pain, frequency, genital sores, hematuria, penile discharge, penile pain, penile swelling, scrotal swelling, testicular pain and urgency.  Musculoskeletal:  Negative for back pain and myalgias.  Neurological:  Negative for dizziness, light-headedness and headaches.  All other systems reviewed and are negative.   Physical Exam Triage Vital Signs ED Triage Vitals [03/31/21 1743]  Enc Vitals Group     BP      Pulse      Resp      Temp      Temp src      SpO2      Weight      Height      Head Circumference      Peak Flow      Pain Score 4     Pain Loc      Pain Edu?      Excl. in GC?    No data found.  Updated Vital Signs BP 137/86   Pulse 96   Temp 99 F (37.2 C) (Oral)   Resp 16   SpO2 95%   Visual Acuity Right Eye Distance:   Left Eye Distance:   Bilateral  Distance:    Right Eye Near:   Left Eye Near:    Bilateral Near:     Physical Exam Vitals reviewed.  Constitutional:      General: He is not in acute distress.    Appearance: Normal appearance. He is not ill-appearing.  HENT:     Head: Normocephalic and atraumatic.     Mouth/Throat:     Mouth: Mucous membranes are moist.     Comments: Moist mucous membranes Eyes:     Extraocular Movements: Extraocular movements intact.     Pupils: Pupils are equal, round, and reactive to light.  Cardiovascular:     Rate and Rhythm: Normal rate and regular rhythm.     Heart sounds: Normal heart sounds.  Pulmonary:     Effort: Pulmonary effort is normal.     Breath sounds: Normal breath sounds. No wheezing, rhonchi or rales.  Abdominal:     General: Bowel  sounds are normal. There is no distension.     Palpations: Abdomen is soft. There is no mass.     Tenderness: There is no abdominal tenderness. There is no right CVA tenderness, left CVA tenderness, guarding or rebound.  Genitourinary:    Comments: deferred Skin:    General: Skin is warm.     Capillary Refill: Capillary refill takes less than 2 seconds.     Comments: Good skin turgor  Neurological:     General: No focal deficit present.     Mental Status: He is alert and oriented to person, place, and time.  Psychiatric:        Mood and Affect: Mood normal.        Behavior: Behavior normal.     UC Treatments / Results  Labs (all labs ordered are listed, but only abnormal results are displayed) Labs Reviewed  CYTOLOGY, (ORAL, ANAL, URETHRAL) ANCILLARY ONLY    EKG   Radiology No results found.  Procedures Procedures (including critical care time)  Medications Ordered in UC Medications  cefTRIAXone (ROCEPHIN) injection 500 mg (has no administration in time range)    Initial Impression / Assessment and Plan / UC Course  I have reviewed the triage vital signs and the nursing notes.  Pertinent labs & imaging results that were available during my care of the patient were reviewed by me and considered in my medical decision making (see chart for details).     This patient is a very pleasant 35 y.o. year old male presenting with dysuria following unprotected intercourse with a new male partner. Afebrile, nontachycardic, no reproducible abd pain or CVAT.   Trichomonas 01/2021. Will send self-swab for G/C, trich. Declines HIV, RPR. Safe sex precautions.   Patient prefers to treat today with Rocephin, doxycycline, Flagyl.  Sent.  ED return precautions discussed. Patient verbalizes understanding and agreement.   Coding Level 4 for review of past notes/labs, order and interpretation of labs today, and prescription drug management   Final Clinical Impressions(s) / UC  Diagnoses   Final diagnoses:  Dysuria  Routine screening for STI (sexually transmitted infection)     Discharge Instructions      -We treated you for gonorrhea today with an antibiotic called Rocephin. -For chlamydia, doxycycline twice daily for 7 days.  Make sure to wear sunscreen while spending time outside while on this medication as it can increase your chance of sunburn. You can take this medication with food if you have a sensitive stomach. -For trichomonas, start the antibiotic-Flagyl (metronidazole), 2 pills daily for 7  days.  You can take this with food if you have a sensitive stomach.  Avoid alcohol while taking this medication and for 2 days after as this will cause severe nausea and vomiting. -Abstain from intercourse until negative results and you have completed treatment.     ED Prescriptions     Medication Sig Dispense Auth. Provider   doxycycline (VIBRAMYCIN) 100 MG capsule Take 1 capsule (100 mg total) by mouth 2 (two) times daily for 7 days. 14 capsule Rhys Martini, PA-C   metroNIDAZOLE (FLAGYL) 500 MG tablet Take 1 tablet (500 mg total) by mouth 2 (two) times daily. Avoid alcohol while taking this medication and for 2 days after 14 tablet Rhys Martini, PA-C      PDMP not reviewed this encounter.   Rhys Martini, PA-C 03/31/21 1807

## 2021-03-31 NOTE — ED Triage Notes (Signed)
Penile pain and pain with urination. Denies discharge.

## 2021-04-02 LAB — CYTOLOGY, (ORAL, ANAL, URETHRAL) ANCILLARY ONLY
Chlamydia: NEGATIVE
Comment: NEGATIVE
Comment: NEGATIVE
Comment: NORMAL
Neisseria Gonorrhea: NEGATIVE
Trichomonas: NEGATIVE

## 2021-04-03 ENCOUNTER — Other Ambulatory Visit: Payer: Self-pay

## 2021-04-03 ENCOUNTER — Ambulatory Visit (HOSPITAL_COMMUNITY)
Admission: EM | Admit: 2021-04-03 | Discharge: 2021-04-03 | Disposition: A | Discharge status: Home / Self Care | Payer: Medicaid Other

## 2021-04-03 NOTE — ED Triage Notes (Signed)
Pt called from front lobby no answer. 

## 2021-04-03 NOTE — ED Notes (Signed)
Called in lobby no answered.  

## 2021-06-05 ENCOUNTER — Ambulatory Visit (HOSPITAL_COMMUNITY)
Admission: EM | Admit: 2021-06-05 | Discharge: 2021-06-05 | Disposition: A | Discharge status: Home / Self Care | Payer: Medicaid Other | Attending: Medical Oncology | Admitting: Medical Oncology

## 2021-06-05 ENCOUNTER — Other Ambulatory Visit: Payer: Self-pay

## 2021-06-05 ENCOUNTER — Encounter (HOSPITAL_COMMUNITY): Payer: Self-pay

## 2021-06-05 DIAGNOSIS — R369 Urethral discharge, unspecified: Secondary | ICD-10-CM | POA: Insufficient documentation

## 2021-06-05 LAB — HIV ANTIBODY (ROUTINE TESTING W REFLEX): HIV Screen 4th Generation wRfx: NONREACTIVE

## 2021-06-05 MED ORDER — CEFTRIAXONE SODIUM 500 MG IJ SOLR
INTRAMUSCULAR | Status: AC
  Filled 2021-06-05: qty 500

## 2021-06-05 MED ORDER — LIDOCAINE HCL (PF) 1 % IJ SOLN
INTRAMUSCULAR | Status: AC
  Filled 2021-06-05: qty 2

## 2021-06-05 MED ORDER — DOXYCYCLINE HYCLATE 100 MG PO CAPS
100.0000 mg | ORAL_CAPSULE | Freq: Two times a day (BID) | ORAL | 0 refills | Status: DC
Start: 2021-06-05 — End: 2021-08-09

## 2021-06-05 MED ORDER — CEFTRIAXONE SODIUM 500 MG IJ SOLR
500.0000 mg | Freq: Once | INTRAMUSCULAR | Status: AC
  Administered 2021-06-05: 500 mg via INTRAMUSCULAR

## 2021-06-05 NOTE — ED Triage Notes (Signed)
Pt c/o green penile discharge x2 days.

## 2021-06-05 NOTE — ED Provider Notes (Signed)
[facility]    CSN: [id number] Arrival date & time: 06/05/21  1311      History   Chief Complaint Chief Complaint  Patient presents with   SEXUALLY TRANSMITTED DISEASE    HPI Antonio Richardson is a 35 y.o. male.   HPI  Penile Discharge: Pt reports that for the past 2 days he has had green penile discharge. He denies any known STI exposures. No fevers, testicular pain, dysuria. He has not tried anything for symptoms.   History reviewed. No pertinent past medical history.  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 No pertinent surgical history.   Home Medications    Prior to Admission medications   Medication Sig Start Date End Date Taking? Authorizing Provider  doxycycline (VIBRAMYCIN) 100 MG capsule Take 1 capsule (100 mg total) by mouth 2 (two) times daily. 06/05/21  Yes Tensley Wery, Brand Males, PA-C    Family History Family History  Problem Relation Age of Onset   Healthy Mother    Healthy Father     Social History Social History   Tobacco Use   Smoking status: Some Days    Types: Cigarettes   Smokeless tobacco: Never  Vaping Use   Vaping Use: Some days  Substance Use Topics   Alcohol use: Yes    Comment: socially   Drug use: Yes    Types: Marijuana     Allergies   Patient has no known allergies.   Review of Systems Review of Systems  As stated above in HPI Physical Exam Triage Vital Signs ED Triage Vitals  Enc Vitals Group     BP 06/05/21 1546 131/68     Pulse Rate 06/05/21 1546 81     Resp 06/05/21 1546 18     Temp 06/05/21 1546 98.2 F (36.8 C)     Temp Source 06/05/21 1546 Oral     SpO2 06/05/21 1546 97 %     Weight --      Height --      Head Circumference --      Peak Flow --      Pain Score 06/05/21 1547 0     Pain Loc --      Pain Edu? --      Excl. in GC? --    No data found.  Updated Vital  Signs BP 131/68 (BP Location: Left Arm)   Pulse 81   Temp 98.2 F (36.8 C) (Oral)   Resp 18   SpO2 97%   Physical Exam Vitals and nursing note reviewed.  Constitutional:      General: He is not in acute distress.    Appearance: Normal appearance. He is not ill-appearing, toxic-appearing or diaphoretic.  Cardiovascular:     Rate and Rhythm: Normal rate and regular rhythm.     Heart sounds: Normal heart sounds.  Pulmonary:     Effort: Pulmonary effort is normal.     Breath sounds: Normal breath sounds.  Skin:    General: Skin is warm.  Neurological:     Mental Status: He is alert and oriented to person, place, and time.     UC Treatments / Results  Labs (all labs ordered are listed, but only abnormal results are displayed) Labs Reviewed  RPR  HIV ANTIBODY (ROUTINE TESTING W REFLEX)  CYTOLOGY, (ORAL, ANAL, URETHRAL) ANCILLARY ONLY    EKG  Radiology No results found.  Procedures Procedures (including critical care time)  Medications Ordered in UC Medications  cefTRIAXone (ROCEPHIN) injection 500 mg (has no administration in time range)    Initial Impression / Assessment and Plan / UC Course  I have reviewed the triage vital signs and the nursing notes.  Pertinent labs & imaging results that were available during my care of the patient were reviewed by me and considered in my medical decision making (see chart for details).     New. STI testing. Treating given pt preference and symptoms as new policy. Discussed safe sex practices.   Final Clinical Impressions(s) / UC Diagnoses   Final diagnoses:  Penile discharge   Discharge Instructions   None    ED Prescriptions     Medication Sig Dispense Auth. Provider   doxycycline (VIBRAMYCIN) 100 MG capsule Take 1 capsule (100 mg total) by mouth 2 (two) times daily. 20 capsule Rushie Chestnut, [place]      PDMP not reviewed this encounter.   Rushie Chestnut, [place] 06/05/21 1604

## 2021-06-06 ENCOUNTER — Telehealth (HOSPITAL_COMMUNITY): Payer: Self-pay | Admitting: Emergency Medicine

## 2021-06-06 LAB — CYTOLOGY, (ORAL, ANAL, URETHRAL) ANCILLARY ONLY
Chlamydia: POSITIVE — AB
Comment: NEGATIVE
Comment: NEGATIVE
Comment: NORMAL
Neisseria Gonorrhea: NEGATIVE
Trichomonas: POSITIVE — AB

## 2021-06-06 LAB — RPR: RPR Ser Ql: NONREACTIVE

## 2021-06-06 MED ORDER — METRONIDAZOLE 500 MG PO TABS: 2000.0000 mg | ORAL_TABLET | Freq: Once | ORAL | 0 refills | Status: AC

## 2021-08-07 ENCOUNTER — Encounter (HOSPITAL_COMMUNITY): Payer: Self-pay | Admitting: Emergency Medicine

## 2021-08-07 ENCOUNTER — Ambulatory Visit (HOSPITAL_COMMUNITY)
Admission: EM | Admit: 2021-08-07 | Discharge: 2021-08-07 | Disposition: A | Discharge status: Home / Self Care | Payer: Medicaid Other | Attending: Emergency Medicine | Admitting: Emergency Medicine

## 2021-08-07 ENCOUNTER — Other Ambulatory Visit: Payer: Self-pay

## 2021-08-07 DIAGNOSIS — Z113 Encounter for screening for infections with a predominantly sexual mode of transmission: Secondary | ICD-10-CM | POA: Insufficient documentation

## 2021-08-07 DIAGNOSIS — Z202 Contact with and (suspected) exposure to infections with a predominantly sexual mode of transmission: Secondary | ICD-10-CM

## 2021-08-07 NOTE — ED Triage Notes (Signed)
Denies penile discharge.  Requesting std evaluation

## 2021-08-07 NOTE — ED Provider Notes (Signed)
[facility]    CSN: [id number] Arrival date & time: 08/07/21  0858      History   Chief Complaint Chief Complaint  Patient presents with   SEXUALLY TRANSMITTED DISEASE    HPI [name] is a 36 y.o. male.   Patient presents requesting routine STI testing.  Denies all symptoms.  No known exposure.  Sexually active, 1 partner, no condom use.  History reviewed. No pertinent past medical history.  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 No pertinent surgical history.     Home Medications    Prior to Admission medications   Medication Sig Start Date End Date Taking? Authorizing Provider  doxycycline (VIBRAMYCIN) 100 MG capsule Take 1 capsule (100 mg total) by mouth 2 (two) times daily. Patient not taking: Reported on 08/07/2021 06/05/21   Rushie Chestnut, PA-C    Family History Family History  Problem Relation Age of Onset   Healthy Mother    Healthy Father     Social History Social History   Tobacco Use   Smoking status: Some Days    Types: Cigarettes   Smokeless tobacco: Never  Vaping Use   Vaping Use: Some days  Substance Use Topics   Alcohol use: Not Currently    Comment: socially   Drug use: Yes    Types: Marijuana     Allergies   Patient has no known allergies.   Review of Systems Review of Systems  Constitutional: Negative.   Genitourinary: Negative.   Skin: Negative.   Neurological: Negative.     Physical Exam Triage Vital Signs ED Triage Vitals  Enc Vitals Group     BP 08/07/21 1012 112/73     Pulse Rate 08/07/21 1012 84     Resp 08/07/21 1012 20     Temp 08/07/21 1012 98.2 F (36.8 C)     Temp Source 08/07/21 1012 Oral     SpO2 08/07/21 1012 98 %     Weight --      Height --      Head Circumference --      Peak Flow --      Pain Score 08/07/21 1010 0     Pain Loc --      Pain Edu? --       Excl. in GC? --    No data found.  Updated Vital Signs BP 112/73 (BP Location: Right Arm)    Pulse 84    Temp 98.2 F (36.8 C) (Oral)    Resp 20    SpO2 98%   Visual Acuity Right Eye Distance:   Left Eye Distance:   Bilateral Distance:    Right Eye Near:   Left Eye Near:    Bilateral Near:     Physical Exam Constitutional:      Appearance: Normal appearance. He is normal weight.  Eyes:     Extraocular Movements: Extraocular movements intact.  Pulmonary:     Effort: Pulmonary effort is normal.  Genitourinary:    Comments: Deferred, self collect urethra swab Neurological:     Mental Status: He is alert and oriented to person, place, and time. Mental status is at baseline.  Psychiatric:        Mood and Affect: Mood normal.        Behavior: Behavior normal.     UC Treatments / Results  Labs (  all labs ordered are listed, but only abnormal results are displayed) Labs Reviewed  CYTOLOGY, (ORAL, ANAL, URETHRAL) ANCILLARY ONLY    EKG   Radiology No results found.  Procedures Procedures (including critical care time)  Medications Ordered in UC Medications - No data to display  Initial Impression / Assessment and Plan / UC Course  I have reviewed the triage vital signs and the nursing notes.  Pertinent labs & imaging results that were available during my care of the patient were reviewed by me and considered in my medical decision making (see chart for details).  Routine screening for sti   Sti labs pending, declined HIV, RPR , will treat per protocol, advised abstinence until labs result and/or treatment complete, advised condom use during all sexual encounters moving forward  Final Clinical Impressions(s) / UC Diagnoses   Final diagnoses:  Routine screening for STI (sexually transmitted infection)     Discharge Instructions      Labs pending 2-3 days, you will be contacted if positive for any sti and treatment will be sent to the pharmacy, you will  have to return to the clinic if positive for gonorrhea to receive treatment   Please refrain from having sex until labs results, if positive please refrain from having sex until treatment complete and symptoms resolve   If positive for  Chlamydia  gonorrhea or trichomoniasis please notify partner or partners so they may tested as well  Moving forward, it is recommended you use some form of protection against the transmission of sti infections  such as condoms or dental dams with each sexual encounter     ED Prescriptions   None    PDMP not reviewed this encounter.   Valinda Hoar, NP 08/07/21 1029

## 2021-08-07 NOTE — Discharge Instructions (Addendum)
Labs pending 2-3 days, you will be contacted if positive for any sti and treatment will be sent to the pharmacy, you will have to return to the clinic if positive for gonorrhea to receive treatment   Please refrain from having sex until labs results, if positive please refrain from having sex until treatment complete and symptoms resolve   If positive for  Chlamydia  gonorrhea or trichomoniasis please notify partner or partners so they may tested as well  Moving forward, it is recommended you use some form of protection against the transmission of sti infections  such as condoms or dental dams with each sexual encounter    

## 2021-08-08 LAB — CYTOLOGY, (ORAL, ANAL, URETHRAL) ANCILLARY ONLY
Chlamydia: POSITIVE — AB
Comment: NEGATIVE
Comment: NEGATIVE
Comment: NORMAL
Neisseria Gonorrhea: NEGATIVE
Trichomonas: POSITIVE — AB

## 2021-08-09 ENCOUNTER — Telehealth (HOSPITAL_COMMUNITY): Payer: Self-pay | Admitting: Emergency Medicine

## 2021-08-09 MED ORDER — METRONIDAZOLE 500 MG PO TABS: 2000.0000 mg | ORAL_TABLET | Freq: Once | ORAL | 0 refills | Status: AC

## 2021-08-09 MED ORDER — DOXYCYCLINE HYCLATE 100 MG PO CAPS: 100.0000 mg | ORAL_CAPSULE | Freq: Two times a day (BID) | ORAL | 0 refills | Status: DC

## 2021-08-13 ENCOUNTER — Encounter (HOSPITAL_COMMUNITY): Payer: Self-pay | Admitting: Emergency Medicine

## 2021-08-13 ENCOUNTER — Emergency Department (HOSPITAL_COMMUNITY)
Admission: EM | Admit: 2021-08-13 | Discharge: 2021-08-15 | Disposition: A | Discharge status: Home / Self Care | Payer: No Typology Code available for payment source | Attending: Emergency Medicine | Admitting: Emergency Medicine

## 2021-08-13 ENCOUNTER — Other Ambulatory Visit: Payer: Self-pay

## 2021-08-13 DIAGNOSIS — F29 Unspecified psychosis not due to a substance or known physiological condition: Secondary | ICD-10-CM | POA: Insufficient documentation

## 2021-08-13 DIAGNOSIS — R456 Violent behavior: Secondary | ICD-10-CM | POA: Diagnosis present

## 2021-08-13 DIAGNOSIS — Z046 Encounter for general psychiatric examination, requested by authority: Secondary | ICD-10-CM | POA: Diagnosis not present

## 2021-08-13 DIAGNOSIS — Z20822 Contact with and (suspected) exposure to covid-19: Secondary | ICD-10-CM | POA: Insufficient documentation

## 2021-08-13 DIAGNOSIS — F209 Schizophrenia, unspecified: Secondary | ICD-10-CM

## 2021-08-13 DIAGNOSIS — F1219 Cannabis abuse with unspecified cannabis-induced disorder: Secondary | ICD-10-CM | POA: Diagnosis present

## 2021-08-13 HISTORY — DX: Schizoaffective disorder, unspecified: F25.9

## 2021-08-13 LAB — COMPREHENSIVE METABOLIC PANEL
ALT: 15 U/L (ref 0–44)
AST: 18 U/L (ref 15–41)
Albumin: 4.1 g/dL (ref 3.5–5.0)
Alkaline Phosphatase: 45 U/L (ref 38–126)
Anion gap: 11 (ref 5–15)
BUN: 10 mg/dL (ref 6–20)
CO2: 26 mmol/L (ref 22–32)
Calcium: 9.3 mg/dL (ref 8.9–10.3)
Chloride: 97 mmol/L — ABNORMAL LOW (ref 98–111)
Creatinine, Ser: 1.23 mg/dL (ref 0.61–1.24)
GFR, Estimated: >60 mL/min (ref >60)
Glucose, Bld: 104 mg/dL — ABNORMAL HIGH (ref 70–99)
Potassium: 3.6 mmol/L (ref 3.5–5.1)
Sodium: 134 mmol/L — ABNORMAL LOW (ref 135–145)
Total Bilirubin: 0.7 mg/dL (ref 0.3–1.2)
Total Protein: 7.6 g/dL (ref 6.5–8.1)

## 2021-08-13 LAB — CBC WITH DIFFERENTIAL/PLATELET
Abs Immature Granulocytes: 0.01 10*3/uL (ref 0.00–0.07)
Basophils Absolute: 0 10*3/uL (ref 0.0–0.1)
Basophils Relative: 1 %
Eosinophils Absolute: 0.1 10*3/uL (ref 0.0–0.5)
Eosinophils Relative: 1 %
HCT: 45 % (ref 39.0–52.0)
Hemoglobin: 15.3 g/dL (ref 13.0–17.0)
Immature Granulocytes: 0 %
Lymphocytes Relative: 20 %
Lymphs Abs: 1.5 10*3/uL (ref 0.7–4.0)
MCH: 31 pg (ref 26.0–34.0)
MCHC: 34 g/dL (ref 30.0–36.0)
MCV: 91.3 fL (ref 80.0–100.0)
Monocytes Absolute: 0.7 10*3/uL (ref 0.1–1.0)
Monocytes Relative: 10 %
Neutro Abs: 5.1 10*3/uL (ref 1.7–7.7)
Neutrophils Relative %: 68 %
Platelets: 300 10*3/uL (ref 150–400)
RBC: 4.93 MIL/uL (ref 4.22–5.81)
RDW: 11.4 % — ABNORMAL LOW (ref 11.5–15.5)
WBC: 7.4 10*3/uL (ref 4.0–10.5)
nRBC: 0 % (ref 0.0–0.2)

## 2021-08-13 LAB — RESP PANEL BY RT-PCR (FLU A&B, COVID) ARPGX2
Influenza A by PCR: NEGATIVE
Influenza B by PCR: NEGATIVE
SARS Coronavirus 2 by RT PCR: NEGATIVE

## 2021-08-13 LAB — SALICYLATE LEVEL: Salicylate Lvl: <7 mg/dL — ABNORMAL LOW (ref 7.0–30.0)

## 2021-08-13 LAB — ETHANOL: Alcohol, Ethyl (B): <10 mg/dL (ref <10)

## 2021-08-13 LAB — ACETAMINOPHEN LEVEL: Acetaminophen (Tylenol), Serum: <10 ug/mL — ABNORMAL LOW (ref 10–30)

## 2021-08-13 MED ORDER — LORAZEPAM 2 MG/ML IJ SOLN
2.0000 mg | Freq: Once | INTRAMUSCULAR | Status: AC
  Administered 2021-08-13: 2 mg via INTRAMUSCULAR
  Filled 2021-08-13: qty 1

## 2021-08-13 MED ORDER — ZIPRASIDONE MESYLATE 20 MG IM SOLR
20.0000 mg | Freq: Once | INTRAMUSCULAR | Status: AC | PRN
  Administered 2021-08-14: 20 mg via INTRAMUSCULAR
  Filled 2021-08-13 (×2): qty 20

## 2021-08-13 MED ORDER — LORAZEPAM 2 MG/ML IJ SOLN
1.0000 mg | Freq: Four times a day (QID) | INTRAMUSCULAR | Status: DC | PRN
  Filled 2021-08-13 (×2): qty 1
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
## 2021-08-13 NOTE — ED Notes (Addendum)
Attempted an EKG and vital signs on the patient, to which he was partially compliant. Patient refused to lay on his back and requested that I get the EKG from his side-lying position. After I placed (2) leads, the patient abruptly pulled the cords off and said, "get that shit off bruh, i'm not doing that shit." The patient proceeded to turn over in bed and go back to sleep.

## 2021-08-13 NOTE — ED Notes (Signed)
Attempted to obtain temp, pt unable to keep from biting temperature.

## 2021-08-13 NOTE — ED Notes (Signed)
Received verbal report from Meghan M RN at this time °

## 2021-08-13 NOTE — ED Provider Notes (Signed)
[facility] EMERGENCY DEPARTMENT Provider Note   CSN: [id number] Arrival date & time: 08/13/21  1227     History  Chief Complaint  Patient presents with   IVC    Antonio Richardson is a 36 y.o. male.  HPI Patient is brought back to the room by [employer].  Patient had been in triage and was being evaluated for erratic behavior and concern for more recent diagnosis of schizophrenia.  Patient reportedly had been talking to himself and cursing and family members had concern for his safety.  Family members had also reported that the patient had shown aggression and swelling at someone earlier.  During that initial evaluation it was determined that the patient was psychotic in appearance and had risk for himself or others and was placed in IVC status.  While in the triage, patient managed to bolt for the door and eloped.  [employer] reports they found him on [facility] and he mildly resisted arrest, not wishing to be transported back to the hospital, but was transported without incident.  At this time, patient will not verbally interact with me.  He is lying on the stretcher with handcuffs in front.  He is keeping his eyes closed and will not respond to any my questions.    Home Medications Prior to Admission medications   Not on File      Allergies    Patient has no known allergies.    Review of Systems   Review of Systems Level 5 caveat cannot obtain review of systems at this time due to patient noncommunication Physical Exam Updated Vital Signs BP (!) 160/106 (BP Location: Right Arm)    Pulse 96    Resp 20    SpO2 98%  Physical Exam Constitutional:      Comments: Patient is lying on stretcher with eyes closed.  Well-nourished well-developed thin male.  No respiratory distress.  HENT:     Mouth/Throat:     Pharynx: Oropharynx is clear.  Eyes:     Extraocular Movements: Extraocular movements intact.     Pupils: Pupils are equal, round, and reactive to light.   Cardiovascular:     Rate and Rhythm: Normal rate and regular rhythm.  Pulmonary:     Effort: Pulmonary effort is normal.     Breath sounds: Normal breath sounds.  Abdominal:     General: There is no distension.     Palpations: Abdomen is soft.  Musculoskeletal:        General: No swelling. Normal range of motion.     Right lower leg: No edema.     Left lower leg: No edema.  Skin:    General: Skin is warm and dry.  Neurological:     Comments: Patient is awake and can answer questions.  He will go back to sleep if left alone but then intermittently wakes up making demands.  All movements are coordinated purposeful symmetric.  No focal motor deficits.  Patient can ambulate if desired.  Psychiatric:     Comments: Keeping eyes closed.  Will not answer any questions at this time.    ED Results / Procedures / Treatments   Labs (all labs ordered are listed, but only abnormal results are displayed) Labs Reviewed  COMPREHENSIVE METABOLIC PANEL - Abnormal; Notable for the following components:      Result Value   Sodium 134 (*)    Chloride 97 (*)    Glucose, Bld 104 (*)    All other components within  normal limits  CBC WITH DIFFERENTIAL/PLATELET - Abnormal; Notable for the following components:   RDW 11.4 (*)    All other components within normal limits  SALICYLATE LEVEL - Abnormal; Notable for the following components:   Salicylate Lvl [id number] (*)    All other components within normal limits  ACETAMINOPHEN LEVEL - Abnormal; Notable for the following components:   Acetaminophen (Tylenol), Serum <10 (*)    All other components within normal limits  RESP PANEL BY RT-PCR (FLU A&B, COVID) ARPGX2  ETHANOL  RAPID URINE DRUG SCREEN, HOSP PERFORMED  HEPATITIS PANEL, ACUTE  RAPID HIV SCREEN (HIV 1/2 AB+AG)  RPR    EKG None  Radiology No results found.  Procedures Procedures    Medications Ordered in ED Medications  LORazepam (ATIVAN) injection 2 mg (2 mg Intramuscular Given  08/13/21 1430)    ED Course/ Medical Decision Making/ A&P Clinical Course as of 08/13/21 2255  Tue Aug 13, 2021  1750 Patient is now sleeping.  When I lightly try to awaken him with tactile stimulus, patient opened his eyes and told me to "get the hell away from me".  No respiratory distress. [MP]    Clinical Course User Index [MP] Charlesetta Shanks, MD                           Medical Decision Making IVC paperwork reviewed.  Case reviewed with [employer] for additional history.  EMR reviewed for additional history and past medical history.  Orders have been placed for TTS consult and labs for psychiatric evaluation.  EMR indicates patient has recently had positive STI and been treated with doxycycline and Rocephin.  Due to possibility of neuropsychiatric conditions  associated with latent STI, will add RPR, HIV and hepatitis panel.  Patient is currently noncommunicative, and I cannot make further judgment on his psychiatric state beyond what is already documented from earlier today.  Prior documentation from 3 hours ago indicates patient was exhibiting acute psychosis.  Patient will remain in IVC condition until diagnostic evaluation and psychiatric evaluation completed.  22: 91 nurses advised the patient was getting a little bit agitated.  He has been standing up and calling for his phone in hostile tones.  He has since laid back down in bed.  I went to talk to the patient to see if he would give some additional historical information.  He reports he "got messed up".  He does not commit to whether or not this was related to any drug ingestion.  He reports "not necessarily".  He advised that he better have his phone return to him by morning or he is going to "F someone up".  He reports, "we can tell the police,  we can tell the [place], we can tell the president, and he will "F' all of them up if he does not get his phone back soon."  He then started laughing somewhat and seemingly looking at someone in the  hall.  He then started singing a bit.  Patient is still exhibiting signs of being acutely psychotic.  He is not safe for discharge.  He will require continued observation and TTS evaluation.  Patient may require sedation.  He had earlier forcibly left the emergency department and is threatening to forcibly leave again if he does not get the things he asks for.  Patient continues to require IVC status and TTS consult.  Patient has refused to allow EKG.  Reviewed screening labs which are  otherwise normal.  COVID and influenza tests are negative.  Clinically patient does not show signs of being acutely ill.  Clinical impressions for possible substance induced psychosis.  Patient however also has diagnosis of schizophrenia.  This could be schizophrenic break with psychosis.  Patient is medically cleared.  Final Clinical Impression(s) / ED Diagnoses Final diagnoses:  Psychosis, unspecified psychosis type [facility]    Rx / [place] Orders ED Discharge Orders     None         Charlesetta Shanks, MD 08/13/21 2257

## 2021-08-13 NOTE — ED Notes (Signed)
Pt moved to triage outside the room, when the door opened he ran out the door, unable to catch him . Police and security called. Police is trying to find him. He was running.

## 2021-08-13 NOTE — ED Notes (Signed)
Pt given purple scrubs and ask to change. Family w pt.

## 2021-08-13 NOTE — ED Notes (Signed)
Attempted to update pt's mother, Rollene Fare, [phone]. Left voicemail
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
## 2021-08-13 NOTE — ED Notes (Signed)
Verbal report given to [facility] S RN at this time

## 2021-08-13 NOTE — ED Notes (Signed)
Moved to purple 52 at this time

## 2021-08-14 MED ORDER — LORAZEPAM 2 MG/ML IJ SOLN
2.0000 mg | Freq: Once | INTRAMUSCULAR | Status: AC
Start: 2021-08-14 — End: 2021-08-14
  Administered 2021-08-14: 2 mg via INTRAMUSCULAR
  Filled 2021-08-14: qty 1

## 2021-08-14 MED ORDER — STERILE WATER FOR INJECTION IJ SOLN
INTRAMUSCULAR | Status: AC
  Filled 2021-08-14: qty 10

## 2021-08-14 MED ORDER — DOXYCYCLINE HYCLATE 100 MG PO TABS
100.0000 mg | ORAL_TABLET | Freq: Two times a day (BID) | ORAL | Status: DC
  Filled 2021-08-14: qty 1

## 2021-08-14 MED ORDER — METRONIDAZOLE 500 MG PO TABS
2000.0000 mg | ORAL_TABLET | Freq: Once | ORAL | Status: DC
  Filled 2021-08-14 (×2): qty 4

## 2021-08-14 MED ORDER — OLANZAPINE 5 MG PO TBDP: 10.0000 mg | ORAL_TABLET | Freq: Once | ORAL | Status: DC

## 2021-08-14 MED ORDER — HALOPERIDOL LACTATE 5 MG/ML IJ SOLN
10.0000 mg | Freq: Once | INTRAMUSCULAR | Status: AC
Start: 2021-08-14 — End: 2021-08-14
  Administered 2021-08-14: 10 mg via INTRAMUSCULAR
  Filled 2021-08-14: qty 2

## 2021-08-14 MED ORDER — OLANZAPINE 5 MG PO TABS: 5.0000 mg | ORAL_TABLET | Freq: Every day | ORAL | Status: DC

## 2021-08-14 MED ORDER — GABAPENTIN 100 MG PO CAPS: 200.0000 mg | ORAL_CAPSULE | Freq: Two times a day (BID) | ORAL | Status: DC

## 2021-08-14 NOTE — ED Notes (Signed)
Pt at desk wanting to leave for home. Pt cussing and returned to slam objects in room.

## 2021-08-14 NOTE — ED Notes (Addendum)
Reported to Dr Jeraldine Loots that pt is c/o lower left side chest pain. Uncertain if its pts attempted to exit from restraints. On palpation no grimacing noticed. Pt resp assessment WDL. Will continue to monitor. No new orders.

## 2021-08-14 NOTE — ED Notes (Addendum)
Pt exiting from restrains for second time after continuous effort in moving all limbs to break free. Pt restraints were repositioned with one arm above head and one arm below. Pt c/o discomfort from restraints. Informed he need to stop moving as he is attempting to remove restraints. Restraints assessed and are loose enough RN able to place two fingers in between. Some redness noted but skin remains intact. Pt encouraged to rest. Pts verbal threats against staff continues.

## 2021-08-14 NOTE — ED Notes (Signed)
This Clinical research associate at [employer] to explain the plan of care ordered by NP. Pt refused PO meds and EKG. Pt reported he was just fine . Pt reported all he needed was to talk to a [facility]  staff.
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
## 2021-08-14 NOTE — ED Notes (Signed)
Pt encouraged to eat before taking meds but refuses.

## 2021-08-14 NOTE — ED Notes (Signed)
Heavy thrashing coming from room. Pt attempting to remove restraints. RN, Starla Link NT, off duty [employer], and sitter at bedside. Pt unsuccessful in exiting restraints agitation continues.

## 2021-08-14 NOTE — ED Notes (Signed)
TC to [facility] to request another TTS assessment .

## 2021-08-14 NOTE — ED Notes (Signed)
Pt's agitation continues despite previous interventions. Pt trashing in bed attempting to get out of cuff. Pt also yelling frustration that he is restrained. Pt given IM ativan 2 mg, previously placed on hold.

## 2021-08-14 NOTE — ED Notes (Signed)
Pt making phone call. 

## 2021-08-14 NOTE — ED Notes (Addendum)
Pt exited bilateral wrist restraints. 4 Security staff assisted holding pt as wrist restraints were reapplied. Pt restraint bilateral wrist to overhead.

## 2021-08-14 NOTE — ED Notes (Signed)
Pt continues to pace in room and talking out loud to self. Pt has pulled off his shirt and rolled up his pants to his knees. Pt has also rolled down his waist band on his pants below pelvic bones.
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
## 2021-08-14 NOTE — ED Notes (Signed)
This Clinical research associate at bed side with doxycycline and flagyl patient refused meds. Pt requested this writer to leave meds at bed side and he would take meds later. This writer reported to Pt  meds can not be left at bed side. Pt still refuses to take meds.

## 2021-08-14 NOTE — Progress Notes (Signed)
Inpatient Behavioral Health Placement  Pt meets inpatient criteria per Nira Conn, NP. There are no appropriate beds at [facility] per Fransico Michael, RN.  Referral was sent to the following facilities;  Destination Service Provider Address Phone Fax  [facility]  [address] [place] [address] [phone]  [facility]  [address] [place] [address] [phone]  [facility] [address]  [address] [place] [address] [phone]  [facility]  [address] [place], [place] [address] [phone]  [facility] [place] [address] [phone]  [facility]  [address], [place] [address] [phone]  [facility]  [address] [place], [place] [address] [phone]  [facility]  [address] [place] [address] [phone]  [facility]  [address], [place] [address] [phone]  [facility]  [address], [place] [address] [phone]  [facility]  [address] [place] [address] [phone]  [facility]  [address], [place] [address] [phone]  [facility]  [address] [place] [address] [phone]  [facility]  [address] [place] [address] [phone]  [facility]  [address] [place] [address] [phone]  [facility]  [address], [place] [address] [phone]  [facility]  [address] [place]  [place] [address] [phone]  [facility]  [address] [place], [place] [address] [phone]  [facility]  [address] [place] [address] [phone]     Situation ongoing,  CSW will follow up.   Maryjean Ka, MSW, LCSWA 08/14/2021  @ 1:30 AM

## 2021-08-14 NOTE — ED Notes (Signed)
Discussed pt continues aggression, history of agressive behavior/outburst, pts continues attempts to remove restraints, and verbalized concerns for pt/staff safety with EDP Preston Fleeting. Informed violent restraints expire at 0000. Order for violent restraints renewed.

## 2021-08-14 NOTE — ED Notes (Signed)
New orders placed by NP.

## 2021-08-14 NOTE — ED Notes (Signed)
[employer] talking with Pt. Pt refuses medication ordered by EDP. Security continues to attempt to deescalate pt by talking . Pt continues argue with security and refuses medication. Pt is anxious and continues to pace in room.
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
## 2021-08-14 NOTE — ED Notes (Signed)
If patient is discharged, please call daughter at [phone].

## 2021-08-14 NOTE — ED Notes (Signed)
Dinner arrived. 

## 2021-08-14 NOTE — ED Notes (Signed)
Pt now at door and closed door . [employer] instructed Pt to open door. Pt jumping around in the room cussing .

## 2021-08-14 NOTE — ED Notes (Signed)
Pt pacing in room demanding to leave . Pt breathing loudly and posturing as he is pacing in his room.

## 2021-08-14 NOTE — ED Notes (Addendum)
Despite environment and verbal dis-escalation pt thrashing full body back and forth in bed. Verbal violence continues to escalate. Threatening staff stating when he gets out of restraints we will have a problem with him. Restrains continue, sitter at bedside, and Haldol previously placed on hold given. Upper side rail lowered for concerns of pt banging head. Other side rail covered with pillow

## 2021-08-15 ENCOUNTER — Emergency Department (HOSPITAL_COMMUNITY): Payer: No Typology Code available for payment source

## 2021-08-15 DIAGNOSIS — F209 Schizophrenia, unspecified: Secondary | ICD-10-CM

## 2021-08-15 MED ORDER — HALOPERIDOL 5 MG PO TABS: 5.0000 mg | ORAL_TABLET | Freq: Every day | ORAL | 0 refills | Status: AC

## 2021-08-15 MED ORDER — DIPHENHYDRAMINE HCL 50 MG/ML IJ SOLN
25.0000 mg | Freq: Two times a day (BID) | INTRAMUSCULAR | Status: DC | PRN
  Administered 2021-08-15: 25 mg via INTRAMUSCULAR
  Filled 2021-08-15: qty 1

## 2021-08-15 MED ORDER — BENZTROPINE MESYLATE 1 MG PO TABS: 1.0000 mg | ORAL_TABLET | Freq: Two times a day (BID) | ORAL | 0 refills | Status: AC

## 2021-08-15 MED ORDER — DROPERIDOL 2.5 MG/ML IJ SOLN
5.0000 mg | Freq: Once | INTRAMUSCULAR | Status: AC | PRN
  Administered 2021-08-15: 5 mg via INTRAMUSCULAR
  Filled 2021-08-15 (×2): qty 2

## 2021-08-15 MED ORDER — LORAZEPAM 2 MG/ML IJ SOLN
1.0000 mg | Freq: Two times a day (BID) | INTRAMUSCULAR | Status: DC | PRN
  Administered 2021-08-15: 1 mg via INTRAMUSCULAR

## 2021-08-15 MED ORDER — HALOPERIDOL LACTATE 5 MG/ML IJ SOLN: 10.0000 mg | Freq: Two times a day (BID) | INTRAMUSCULAR | Status: DC | PRN

## 2021-08-15 MED ORDER — LORAZEPAM 2 MG/ML IJ SOLN: 1.0000 mg | Freq: Once | INTRAMUSCULAR | Status: DC | PRN

## 2021-08-15 NOTE — ED Notes (Signed)
Lab called updating RN samples for hepatitis panel, HIV, and RPR (3 gold tubes) never received. RN informed unable to obtain labs at this time as pt has been aggressive and is finally had medication take effect. Will attempt lab draw if not will report in care handoff to next RN.
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
## 2021-08-15 NOTE — ED Notes (Signed)
Pt resting comfortably. Awaken with q1h VS taken at 200. Pt able to sit up and move torso side to side. Side rail at head of the bed lowered for pt safety. Restrains remain in place. VS WDL.

## 2021-08-15 NOTE — ED Notes (Signed)
Family unable to give Pt a ride home. This Clinical research associate called SW who provided a taxi voucher,

## 2021-08-15 NOTE — ED Notes (Signed)
With on going thrashing, kicking, and movement in bed pt was able to removed left arm restraint. NT Jeri, RN, Sitter, and [employer] at bedside to reposition restraint.

## 2021-08-15 NOTE — ED Notes (Signed)
Pt is finally resting. VS obtained WDL. Pt restraints bilateral wrist and ankles remain in place. Sitter at bedside.

## 2021-08-15 NOTE — ED Notes (Signed)
This Clinical research associate returned TC to Pt's Mother . This Clinical research associate was unable to talk to Mother because no one answered the phone.

## 2021-08-15 NOTE — ED Notes (Signed)
This Probation officer sent to [employer] with [facility] a secure message requesting to have zyprexa changed to IM injection. PT is refusing all PO's.

## 2021-08-15 NOTE — Progress Notes (Signed)
Pharmacist reached out to patient's pharmacy regarding recent home medications. Per pharmacy, the patient hasn't filled any medications with them since 07/2020.  He was previously taking haloperidol 10mg  qhs, losartan 50mg  qd, benztropine 1mg  BID.    Thank you for allowing pharmacy to be a part of this patients care.  , PharmD Clinical Pharmacist  Please check AMION for all [facility] numbers After 10:00 PM, call Main Pharmacy [phone]
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
## 2021-08-15 NOTE — ED Notes (Signed)
TC from Pt Sister . Sister gave the name of Pharmacy that Pt has his meds filled[facility] [address] [phone]
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
## 2021-08-15 NOTE — Discharge Instructions (Addendum)
Follow-up as instructed by behavioral health by establishing outpatient services with:   [facility]  [address], [place], [place] [address] [phone]  New Patient Therapy Walk Ins:  Monday-Wednesday 8 AM thru Slots are Full  Friday 1 PM-5 PM   Availability is limited, therefore early arrival is recommended.
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
## 2021-08-15 NOTE — ED Notes (Signed)
Pt has been cleared by [facility] and has been DC . Unable to locate someone to pick him up. This Clinical research associate just called Pt's Mother to ask about transport  home. Mother reported Niece was going to call an [employer] ride. This Clinical research associate asked what time would the ride come. Pt has been DC.
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
## 2021-08-15 NOTE — ED Notes (Signed)
Pt ate dinner and had snacks before leaving ED for home.

## 2021-08-15 NOTE — ED Notes (Signed)
Restraints DC . Pt has been cleared by [facility] and is DC Home.

## 2021-08-15 NOTE — ED Notes (Signed)
Pt rolled to front lobby in [facility] to wait for taxi. Pt has DC papers in hand .

## 2021-08-15 NOTE — Progress Notes (Signed)
Patient has been denied by [facility] and has been faxed out. Patient meets BH inpatient criteria per Nira Conn, NP. Patient has been faxed out to the following facilities:   [facility]  [address], [place] [address] [phone]  [facility]  [address] [place] [address] [phone]  [facility] [address]  [address] [place] [address] [phone]  [facility]  [address] [place], [place] [address] [phone]  [facility] [place] [address] [phone]  [facility]  [address], [place] [address] [phone]  [facility]  [address] [place], [place] [address] [phone]  [facility]  [address] [place] [address] [phone]  [facility]  [address], [place] [address] [phone]  [facility]  [address] [place] [address] [phone]  [facility]  [address] [place] [address] [phone]  [facility]  [address], [place] [address] [phone]  [facility]  [address] [place] [address] [phone]  [facility]  [address], [place] [address] [phone]  [facility]  [address] [place] [address] [phone]  [facility]  [address] [place] [address] [phone]  [facility]  [address] [place] [address] [phone]  [facility]  [address] [place], [place] [address] [phone]  [facility]  [address] [place] [address] [phone]   Damita Dunnings, MSW, LCSW-A  11:13 AM 08/15/2021

## 2021-08-29 ENCOUNTER — Ambulatory Visit (HOSPITAL_COMMUNITY)
Admission: EM | Admit: 2021-08-29 | Discharge: 2021-08-29 | Disposition: A | Discharge status: Home / Self Care | Payer: Medicaid Other | Attending: Family Medicine | Admitting: Family Medicine

## 2021-08-29 ENCOUNTER — Encounter (HOSPITAL_COMMUNITY): Payer: Self-pay

## 2021-08-29 DIAGNOSIS — Z202 Contact with and (suspected) exposure to infections with a predominantly sexual mode of transmission: Secondary | ICD-10-CM | POA: Diagnosis present

## 2021-08-29 DIAGNOSIS — R300 Dysuria: Secondary | ICD-10-CM

## 2021-08-29 LAB — HIV ANTIBODY (ROUTINE TESTING W REFLEX): HIV Screen 4th Generation wRfx: NONREACTIVE

## 2021-08-29 MED ORDER — DOXYCYCLINE HYCLATE 100 MG PO CAPS: 100.0000 mg | ORAL_CAPSULE | Freq: Two times a day (BID) | ORAL | 0 refills | Status: AC

## 2021-08-29 MED ORDER — METRONIDAZOLE 500 MG PO TABS: 2000.0000 mg | ORAL_TABLET | Freq: Once | ORAL | 0 refills | Status: AC

## 2021-08-29 NOTE — ED Triage Notes (Signed)
Pt is here for std exposure, he reports some discomfort around his penis.

## 2021-08-29 NOTE — ED Provider Notes (Addendum)
[facility]    CSN: [id number] Arrival date & time: 08/29/21  1635      History   Chief Complaint Chief Complaint  Patient presents with   Exposure to STD    HPI Antonio Richardson is a 36 y.o. male.    Exposure to STD  Here for dysuria that began overnight last night.  He was treated on January 6 with metronidazole and doxycycline for positive trichomonas and positive chlamydia.  He reports that he did finish that medication.  He also reports that partners were treated.  He was admitted on January 10 to the ER for some psychotic behavior, so I do wonder if he was able to finish his last day or 2 of treatment  Past Medical History:  Diagnosis Date   Schizo affective schizophrenia [facility])     Patient Active Problem List   Diagnosis Date Noted   Schizophrenia (HCC) 08/15/2021   Cannabis abuse with cannabis-induced disorder (HCC) 12/06/2019   Involuntary commitment     History reviewed. No pertinent surgical history.     Home Medications    Prior to Admission medications   Medication Sig Start Date End Date Taking? Authorizing Provider  doxycycline (VIBRAMYCIN) 100 MG capsule Take 1 capsule (100 mg total) by mouth 2 (two) times daily for 7 days. 08/29/21 09/05/21 Yes Belky Mundo, Janace Aris, MD  metroNIDAZOLE (FLAGYL) 500 MG tablet Take 4 tablets (2,000 mg total) by mouth once for 1 dose. 08/29/21 08/29/21 Yes Garl Speigner, Janace Aris, MD  benztropine (COGENTIN) 1 MG tablet Take 1 tablet (1 mg total) by mouth 2 (two) times daily. 08/15/21 09/14/21  Chales Abrahams, NP  haloperidol (HALDOL) 5 MG tablet Take 1 tablet (5 mg total) by mouth at bedtime. 08/15/21 09/14/21  Chales Abrahams, NP    Family History Family History  Problem Relation Age of Onset   Healthy Mother    Healthy Father     Social History Social History   Tobacco Use   Smoking status: Some Days    Types: Cigarettes   Smokeless tobacco: Never  Vaping Use   Vaping Use: Some days  Substance Use Topics    Alcohol use: Not Currently    Comment: socially   Drug use: Yes    Types: Marijuana     Allergies   Patient has no known allergies.   Review of Systems Review of Systems   Physical Exam Triage Vital Signs ED Triage Vitals [08/29/21 1657]  Enc Vitals Group     BP 127/76     Pulse Rate 95     Resp 16     Temp 98.3 F (36.8 C)     Temp Source Oral     SpO2 97 %     Weight      Height      Head Circumference      Peak Flow      Pain Score      Pain Loc      Pain Edu?      Excl. in GC?    No data found.  Updated Vital Signs BP 127/76 (BP Location: Left Arm)    Pulse 95    Temp 98.3 F (36.8 C) (Oral)    Resp 16    SpO2 97%   Visual Acuity Right Eye Distance:   Left Eye Distance:   Bilateral Distance:    Right Eye Near:   Left Eye Near:    Bilateral Near:     Physical  Exam Vitals reviewed.  Constitutional:      General: He is not in acute distress.    Appearance: He is not toxic-appearing.  Eyes:     Extraocular Movements: Extraocular movements intact.     Pupils: Pupils are equal, round, and reactive to light.  Cardiovascular:     Rate and Rhythm: Normal rate and regular rhythm.     Heart sounds: No murmur heard. Pulmonary:     Effort: Pulmonary effort is normal.     Breath sounds: Normal breath sounds.  Skin:    Coloration: Skin is not jaundiced or pale.  Neurological:     Mental Status: He is alert and oriented to person, place, and time.  Psychiatric:        Behavior: Behavior normal.     UC Treatments / Results  Labs (all labs ordered are listed, but only abnormal results are displayed) Labs Reviewed  HIV ANTIBODY (ROUTINE TESTING W REFLEX)  RPR  CYTOLOGY, (ORAL, ANAL, URETHRAL) ANCILLARY ONLY    EKG   Radiology No results found.  Procedures Procedures (including critical care time)  Medications Ordered in UC Medications - No data to display  Initial Impression / Assessment and Plan / UC Course  I have reviewed the triage  vital signs and the nursing notes.  Pertinent labs & imaging results that were available during my care of the patient were reviewed by me and considered in my medical decision making (see chart for details).     Will test again and treat as indicated by positive tests. Today he would like HIV/RPR testing also Patient ended up requesting treatment at this time.  Since he was positive for chlamydia and trichomonas earlier this month, and it does not look like he could have possibly finished his medicine, I sent in his doxycycline and metronidazole.  Discussed with him that if his gonorrhea is positive on the test, we will call him for a ceftriaxone shot Final Clinical Impressions(s) / UC Diagnoses   Final diagnoses:  Dysuria  STD exposure     Discharge Instructions      HIV and syphilis blood work have been done today. Also the swab was done for infections. Staff will call you if anything is positive and needs treatment.  Take doxycycline 100 mg 1 twice a day for 7 days.  Take metronidazole 500 mg, 4 tablets once after a good meal.  Do not drink alcohol within 72 hours of taking this medicine.     ED Prescriptions     Medication Sig Dispense Auth. Provider   doxycycline (VIBRAMYCIN) 100 MG capsule Take 1 capsule (100 mg total) by mouth 2 (two) times daily for 7 days. 14 capsule Margee Trentham, Janace Aris, MD   metroNIDAZOLE (FLAGYL) 500 MG tablet Take 4 tablets (2,000 mg total) by mouth once for 1 dose. 4 tablet Ranelle Auker, Janace Aris, MD      PDMP not reviewed this encounter.   Zenia Resides, MD 08/29/21 1712    Zenia Resides, MD 08/29/21 1730

## 2021-08-29 NOTE — Discharge Instructions (Addendum)
HIV and syphilis blood work have been done today. Also the swab was done for infections. Staff will call you if anything is positive and needs treatment.  Take doxycycline 100 mg 1 twice a day for 7 days.  Take metronidazole 500 mg, 4 tablets once after a good meal.  Do not drink alcohol within 72 hours of taking this medicine.

## 2021-08-30 LAB — CYTOLOGY, (ORAL, ANAL, URETHRAL) ANCILLARY ONLY
Chlamydia: POSITIVE — AB
Comment: NEGATIVE
Comment: NEGATIVE
Comment: NORMAL
Neisseria Gonorrhea: NEGATIVE
Trichomonas: NEGATIVE

## 2021-08-30 LAB — RPR: RPR Ser Ql: NONREACTIVE

## 2021-10-06 ENCOUNTER — Encounter (HOSPITAL_COMMUNITY): Payer: Self-pay | Admitting: Emergency Medicine

## 2021-10-06 ENCOUNTER — Emergency Department (HOSPITAL_COMMUNITY)
Admission: EM | Admit: 2021-10-06 | Discharge: 2021-10-06 | Disposition: A | Discharge status: Home / Self Care | Payer: Medicaid Other | Attending: Student | Admitting: Student

## 2021-10-06 ENCOUNTER — Other Ambulatory Visit: Payer: Self-pay

## 2021-10-06 DIAGNOSIS — R200 Anesthesia of skin: Secondary | ICD-10-CM

## 2021-10-06 DIAGNOSIS — R202 Paresthesia of skin: Secondary | ICD-10-CM | POA: Insufficient documentation

## 2021-10-06 LAB — CBG MONITORING, ED: Glucose-Capillary: 93 mg/dL (ref 70–99)

## 2021-10-06 NOTE — ED Notes (Signed)
E-signature pad unavailable at time of pt discharge. This RN discussed discharge materials with pt and answered all pt questions. Pt stated understanding of discharge material. ? ?

## 2021-10-06 NOTE — ED Notes (Signed)
Seen walking out of ED with another Pt.  ?

## 2021-10-06 NOTE — ED Provider Notes (Signed)
?  [facility] ?[facility] Emergency Department ?Provider Note ?MRN:  [id number]  ?Arrival date & time: 10/06/21    ? ?Chief Complaint   ?toe numb ?  ?History of Present Illness   ?Antonio Richardson is a 36 y.o. year-old male presents to the ED with chief complaint of left big toe numbness and tingling.  He states that he noticed the symptoms yesterday.  He denies weakness.  Denies any injury.  He states that he would like to be check for diabetes. He also states that he has a large callous that he'd like taken care of. ? ?History provided by patient. ? ? ?Review of Systems  ?Pertinent review of systems noted in HPI.  ? ? ?Physical Exam  ? ?Vitals:  ? 10/06/21 0110  ?BP: 123/86  ?Pulse: 100  ?Resp: 18  ?Temp: 98.4 ?F (36.9 ?C)  ?SpO2: 97%  ?  ?CONSTITUTIONAL:  well-appearing, NAD ?NEURO:  Alert and oriented x 3, CN 3-12 grossly intact, sharp/dull discrimination intact to great toe ?EYES:  eyes equal and reactive ?ENT/NECK:  Supple, no stridor  ?CARDIO:  normal rate, appears well-perfused  ?PULM:  No respiratory distress,  ?GI/GU:  non-distended,  ?MSK/SPINE:  No gross deformities, no edema, moves all extremities  ?SKIN:  no rash, atraumatic, callous on sole of foot ? ? ?*Additional and/or pertinent findings included in MDM below ? ?Diagnostic and Interventional Summary  ? ? EKG Interpretation ? ?Date/Time:    ?Ventricular Rate:    ?PR Interval:    ?QRS Duration:   ?QT Interval:    ?QTC Calculation:   ?R Axis:     ?Text Interpretation:   ?  ? ?  ? ?Labs Reviewed  ?CBG MONITORING, ED  ?  ?No orders to display  ?  ?Medications - No data to display  ? ?Procedures  /  Critical Care ?Procedures ? ?ED Course and Medical Decision Making  ?I have reviewed the triage vital signs, the nursing notes, and pertinent available records from the EMR. ? ?Complexity of Problems Addressed: ?Low Complexity: Acute, uncomplicated illness or injury requiring no diagnostic workup ?Comorbidities affecting this illness/injury  include: ?None ?Social Determinants Affecting Care: ? ? ? ?ED Course: ?After considering the following differential, overuse injury, neuropathy, I ordered CBG per patient request. ?I personally interpreted the labs which are notable for normal CBG . ? ?  ? ?Consultants: ?No consultations were needed in caring for this patient. ? ?Treatment and Plan: ?We will refer patient to podiatry for his foot callus and tingling of his great toe.  At this point, thought to be over use injury or ?Morton's neuroma. ? ?Emergency department workup does not suggest an emergent condition requiring admission or immediate intervention beyond  what has been performed at this time. The patient is safe for discharge and has  been instructed to return immediately for worsening symptoms, change in  symptoms or any other concerns ? ? ? ?Final Clinical Impressions(s) / ED Diagnoses  ? ?  ICD-10-CM   ?1. Numbness of toes  R20.0   ?  ?  ?ED Discharge Orders   ? ? None  ? ?  ?  ? ? ?Discharge Instructions Discussed with and Provided to Patient:  ? ? ? ?Discharge Instructions   ? ?  ?Please follow-up with the foot doctor listed. ? ? ? ? ?  ?Roxy Horseman, PA-C ?10/06/21 0247 ? ?  ?Glendora Score, MD ?10/06/21 [phone] ? ?

## 2021-10-06 NOTE — Discharge Instructions (Signed)
Please follow-up with the foot doctor listed. ?

## 2021-10-06 NOTE — ED Triage Notes (Signed)
Pt reports his big toe on his left foot is numb.   ?

## 2024-01-02 IMAGING — DX DG CHEST 1V PORT
1 series · 1 of 1 positions shown · non-contrast
Comparison: For [place] 0109

CLINICAL DATA: Left-sided chest injury.

EXAM:
PORTABLE CHEST 1 VIEW

[chest ap]
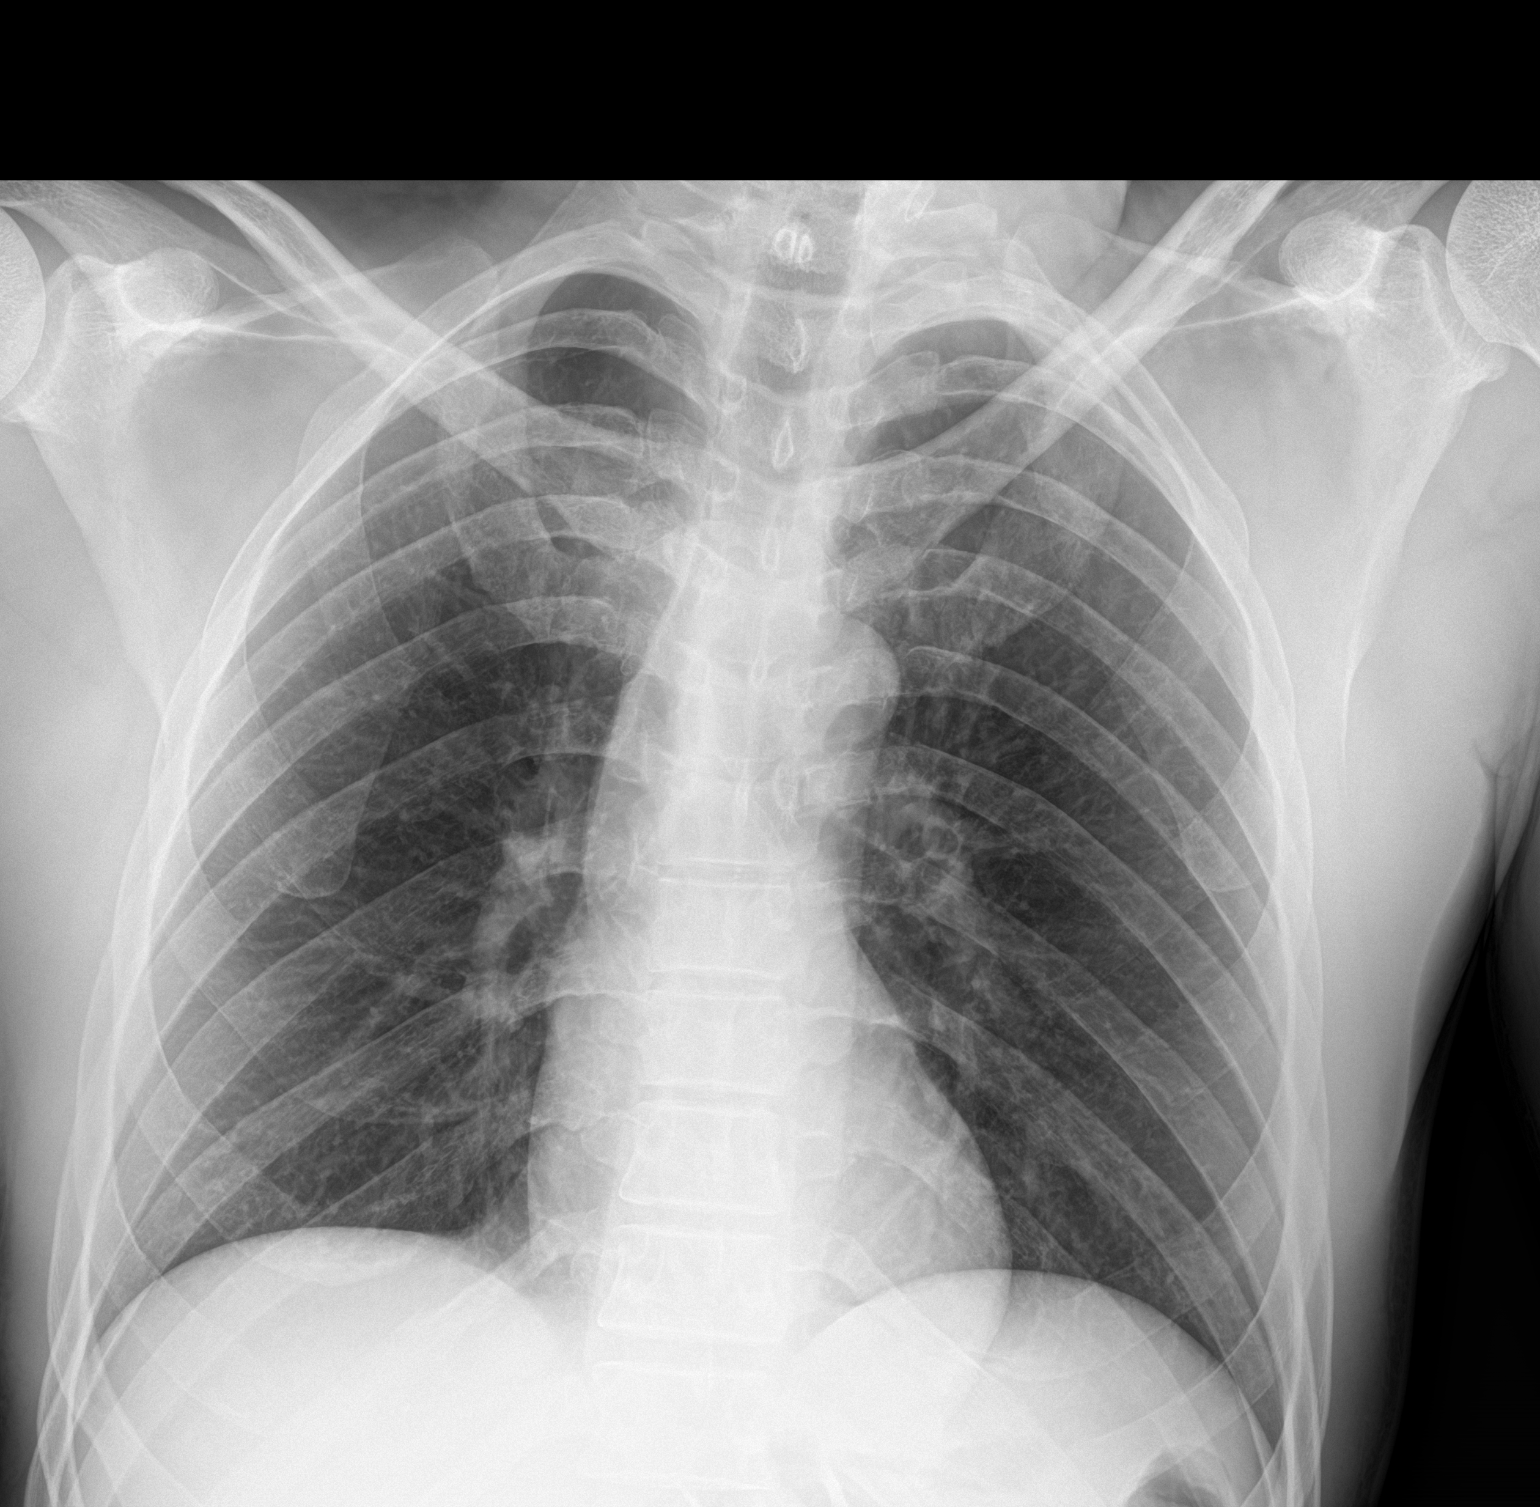

[1 of 1 positions shown; findings below may reference images not displayed]

FINDINGS: The cardiac silhouette, mediastinal and hilar contours are normal.
The lungs are clear. No pulmonary lesions, pleural effusion or
pneumothorax. The bony thorax is intact. No definite rib or clavicle
fractures.
IMPRESSION: No acute cardiopulmonary findings.

Intact bony thorax.
